# Patient Record
Sex: Male | Born: 1995 | Race: Black or African American | Hispanic: No | Marital: Married | Smoking: Former smoker
Health system: Southern US, Community
[De-identification: ages and names within clinical notes are randomized; demographics above are authoritative.]

## PROBLEM LIST (undated history)

## (undated) HISTORY — PX: APPENDECTOMY: SHX54

---
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
## 2014-09-12 ENCOUNTER — Ambulatory Visit (INDEPENDENT_AMBULATORY_CARE_PROVIDER_SITE_OTHER): Payer: BC Managed Care – PPO | Admitting: Internal Medicine

## 2014-09-12 VITALS — BP 110/64 | HR 78 | Temp 98.1°F | Resp 16 | Ht 68.75 in | Wt 128.2 lb

## 2014-09-12 DIAGNOSIS — R1030 Lower abdominal pain, unspecified: Secondary | ICD-10-CM

## 2014-09-12 DIAGNOSIS — K529 Noninfective gastroenteritis and colitis, unspecified: Secondary | ICD-10-CM

## 2014-09-12 LAB — POCT CBC
Granulocyte percent: 61.7 %G (ref 37–80)
HEMATOCRIT: 47.9 % (ref 43.5–53.7)
HEMOGLOBIN: 15.4 g/dL (ref 14.1–18.1)
LYMPH, POC: 1.3 (ref 0.6–3.4)
MCH: 28.8 pg (ref 27–31.2)
MCHC: 32.2 g/dL (ref 31.8–35.4)
MCV: 89.5 fL (ref 80–97)
MID (cbc): 0.3 (ref 0–0.9)
MPV: 7.4 fL (ref 0–99.8)
POC Granulocyte: 2.5 (ref 2–6.9)
POC LYMPH PERCENT: 31.3 %L (ref 10–50)
POC MID %: 7 % (ref 0–12)
Platelet Count, POC: 169 10*3/uL (ref 142–424)
RBC: 5.35 M/uL (ref 4.69–6.13)
RDW, POC: 14.2 %
WBC: 4.1 10*3/uL — AB (ref 4.6–10.2)

## 2014-09-12 LAB — POCT URINALYSIS DIPSTICK
BILIRUBIN UA: NEGATIVE
Blood, UA: NEGATIVE
GLUCOSE UA: NEGATIVE
KETONES UA: NEGATIVE
LEUKOCYTES UA: NEGATIVE
NITRITE UA: NEGATIVE
Protein, UA: NEGATIVE
Spec Grav, UA: 1.01
Urobilinogen, UA: 0.2
pH, UA: 6

## 2014-09-12 NOTE — Patient Instructions (Signed)
Food Choices to Help Relieve Diarrhea °When you have diarrhea, the foods you eat and your eating habits are very important. Choosing the right foods and drinks can help relieve diarrhea. Also, because diarrhea can last up to 7 days, you need to replace lost fluids and electrolytes (such as sodium, potassium, and chloride) in order to help prevent dehydration.  °WHAT GENERAL GUIDELINES DO I NEED TO FOLLOW? °· Slowly drink 1 cup (8 oz) of fluid for each episode of diarrhea. If you are getting enough fluid, your urine will be clear or pale yellow. °· Eat starchy foods. Some good choices include white rice, white toast, pasta, low-fiber cereal, baked potatoes (without the skin), saltine crackers, and bagels. °· Avoid large servings of any cooked vegetables. °· Limit fruit to two servings per day. A serving is ½ cup or 1 small piece. °· Choose foods with less than 2 g of fiber per serving. °· Limit fats to less than 8 tsp (38 g) per day. °· Avoid fried foods. °· Eat foods that have probiotics in them. Probiotics can be found in certain dairy products. °· Avoid foods and beverages that may increase the speed at which food moves through the stomach and intestines (gastrointestinal tract). Things to avoid include: °¨ High-fiber foods, such as dried fruit, raw fruits and vegetables, nuts, seeds, and whole grain foods. °¨ Spicy foods and high-fat foods. °¨ Foods and beverages sweetened with high-fructose corn syrup, honey, or sugar alcohols such as xylitol, sorbitol, and mannitol. °WHAT FOODS ARE RECOMMENDED? °Grains °White rice. White, French, or pita breads (fresh or toasted), including plain rolls, buns, or bagels. White pasta. Saltine, soda, or graham crackers. Pretzels. Low-fiber cereal. Cooked cereals made with water (such as cornmeal, farina, or cream cereals). Plain muffins. Matzo. Melba toast. Zwieback.  °Vegetables °Potatoes (without the skin). Strained tomato and vegetable juices. Most well-cooked and canned  vegetables without seeds. Tender lettuce. °Fruits °Cooked or canned applesauce, apricots, cherries, fruit cocktail, grapefruit, peaches, pears, or plums. Fresh bananas, apples without skin, cherries, grapes, cantaloupe, grapefruit, peaches, oranges, or plums.  °Meat and Other Protein Products °Baked or boiled chicken. Eggs. Tofu. Fish. Seafood. Smooth peanut butter. Ground or well-cooked tender beef, ham, veal, lamb, pork, or poultry.  °Dairy °Plain yogurt, kefir, and unsweetened liquid yogurt. Lactose-free milk, buttermilk, or soy milk. Plain hard cheese. °Beverages °Sport drinks. Clear broths. Diluted fruit juices (except prune). Regular, caffeine-free sodas such as ginger ale. Water. Decaffeinated teas. Oral rehydration solutions. Sugar-free beverages not sweetened with sugar alcohols. °Other °Bouillon, broth, or soups made from recommended foods.  °The items listed above may not be a complete list of recommended foods or beverages. Contact your dietitian for more options. °WHAT FOODS ARE NOT RECOMMENDED? °Grains °Whole grain, whole wheat, bran, or rye breads, rolls, pastas, crackers, and cereals. Wild or brown rice. Cereals that contain more than 2 g of fiber per serving. Corn tortillas or taco shells. Cooked or dry oatmeal. Granola. Popcorn. °Vegetables °Raw vegetables. Cabbage, broccoli, Brussels sprouts, artichokes, baked beans, beet greens, corn, kale, legumes, peas, sweet potatoes, and yams. Potato skins. Cooked spinach and cabbage. °Fruits °Dried fruit, including raisins and dates. Raw fruits. Stewed or dried prunes. Fresh apples with skin, apricots, mangoes, pears, raspberries, and strawberries.  °Meat and Other Protein Products °Chunky peanut butter. Nuts and seeds. Beans and lentils. Bacon.  °Dairy °High-fat cheeses. Milk, chocolate milk, and beverages made with milk, such as milk shakes. Cream. Ice cream. °Sweets and Desserts °Sweet rolls, doughnuts, and sweet breads. Pancakes   and waffles. °Fats and  Oils °Butter. Cream sauces. Margarine. Salad oils. Plain salad dressings. Olives. Avocados.  °Beverages °Caffeinated beverages (such as coffee, tea, soda, or energy drinks). Alcoholic beverages. Fruit juices with pulp. Prune juice. Soft drinks sweetened with high-fructose corn syrup or sugar alcohols. °Other °Coconut. Hot sauce. Chili powder. Mayonnaise. Gravy. Cream-based or milk-based soups.  °The items listed above may not be a complete list of foods and beverages to avoid. Contact your dietitian for more information. °WHAT SHOULD I DO IF I BECOME DEHYDRATED? °Diarrhea can sometimes lead to dehydration. Signs of dehydration include dark urine and dry mouth and skin. If you think you are dehydrated, you should rehydrate with an oral rehydration solution. These solutions can be purchased at pharmacies, retail stores, or online.  °Drink ½-1 cup (120-240 mL) of oral rehydration solution each time you have an episode of diarrhea. If drinking this amount makes your diarrhea worse, try drinking smaller amounts more often. For example, drink 1-3 tsp (5-15 mL) every 5-10 minutes.  °A general rule for staying hydrated is to drink 1½-2 L of fluid per day. Talk to your health care provider about the specific amount you should be drinking each day. Drink enough fluids to keep your urine clear or pale yellow. °Document Released: 01/24/2004 Document Revised: 11/08/2013 Document Reviewed: 09/26/2013 °ExitCare® Patient Information ©2015 [employer]. This information is not intended to replace advice given to you by your health care provider. Make sure you discuss any questions you have with your health care provider. °Viral Gastroenteritis °Viral gastroenteritis is also known as stomach flu. This condition affects the stomach and intestinal tract. It can cause sudden diarrhea and vomiting. The illness typically lasts 3 to 8 days. Most people develop an immune response that eventually gets rid of the virus. While this natural  response develops, the virus can make you quite ill. °CAUSES  °Many different viruses can cause gastroenteritis, such as rotavirus or noroviruses. You can catch one of these viruses by consuming contaminated food or water. You may also catch a virus by sharing utensils or other personal items with an infected person or by touching a contaminated surface. °SYMPTOMS  °The most common symptoms are diarrhea and vomiting. These problems can cause a severe loss of body fluids (dehydration) and a body salt (electrolyte) imbalance. Other symptoms may include: °· Fever. °· Headache. °· Fatigue. °· Abdominal pain. °DIAGNOSIS  °Your caregiver can usually diagnose viral gastroenteritis based on your symptoms and a physical exam. A stool sample may also be taken to test for the presence of viruses or other infections. °TREATMENT  °This illness typically goes away on its own. Treatments are aimed at rehydration. The most serious cases of viral gastroenteritis involve vomiting so severely that you are not able to keep fluids down. In these cases, fluids must be given through an intravenous line (IV). °HOME CARE INSTRUCTIONS  °· Drink enough fluids to keep your urine clear or pale yellow. Drink small amounts of fluids frequently and increase the amounts as tolerated. °· Ask your caregiver for specific rehydration instructions. °· Avoid: °¨ Foods high in sugar. °¨ Alcohol. °¨ Carbonated drinks. °¨ Tobacco. °¨ Juice. °¨ Caffeine drinks. °¨ Extremely hot or cold fluids. °¨ Fatty, greasy foods. °¨ Too much intake of anything at one time. °¨ Dairy products until 24 to 48 hours after diarrhea stops. °· You may consume probiotics. Probiotics are active cultures of beneficial bacteria. They may lessen the amount and number of diarrheal stools in adults. Probiotics   can be found in yogurt with active cultures and in supplements. °· Wash your hands well to avoid spreading the virus. °· Only take over-the-counter or prescription medicines for  pain, discomfort, or fever as directed by your caregiver. Do not give aspirin to children. Antidiarrheal medicines are not recommended. °· Ask your caregiver if you should continue to take your regular prescribed and over-the-counter medicines. °· Keep all follow-up appointments as directed by your caregiver. °SEEK IMMEDIATE MEDICAL CARE IF:  °· You are unable to keep fluids down. °· You do not urinate at least once every 6 to 8 hours. °· You develop shortness of breath. °· You notice blood in your stool or vomit. This may look like coffee grounds. °· You have abdominal pain that increases or is concentrated in one small area (localized). °· You have persistent vomiting or diarrhea. °· You have a fever. °· The patient is a child younger than 3 months, and he or she has a fever. °· The patient is a child older than 3 months, and he or she has a fever and persistent symptoms. °· The patient is a child older than 3 months, and he or she has a fever and symptoms suddenly get worse. °· The patient is a baby, and he or she has no tears when crying. °MAKE SURE YOU:  °· Understand these instructions. °· Will watch your condition. °· Will get help right away if you are not doing well or get worse. °Document Released: 11/03/2005 Document Revised: 01/26/2012 Document Reviewed: 08/20/2011 °ExitCare® Patient Information ©2015 [employer]. This information is not intended to replace advice given to you by your health care provider. Make sure you discuss any questions you have with your health care provider. ° °

## 2014-09-12 NOTE — Progress Notes (Signed)
Subjective:    Patient ID: Cole Wolfe, male    DOB: [birth year], 18 y.o.   MRN: [id number]  HPI  Patient is here complaining of suprapubic abdominal pain, in a band-like pattern. This pain started on Friday, he states it hasn't worsened but has persisted with no relief. He described the pain as a cramping pain. He is also complaining of diarrhea that started on Saturday. He states he has an episode of diarrhea after every meal. He notes the cramping pain is relieved temporarily after a bowel movement. He denies fever or chills. He denies history of GI issues. He denies recent antibiotic use. He denies sick contacts at home. He doesn't complain of any urinary issues. He denies nausea and vomiting. He has tried [employer] Company[employer]pto Bismol with no relief. Started after eating food that may have been bad  Review of Systems     Objective:   Physical Exam  Vitals reviewed. Constitutional: He is oriented to person, place, and time. He appears well-developed and well-nourished. No distress.  HENT:  Head: Normocephalic.  Mouth/Throat: Oropharynx is clear and moist.  Eyes: EOM are normal. No scleral icterus.  Neck: Normal range of motion.  Cardiovascular: Normal rate and normal heart sounds.   Pulmonary/Chest: Effort normal and breath sounds normal.  Abdominal: Soft. Bowel sounds are normal. He exhibits no mass. There is no tenderness.  Neurological: He is alert and oriented to person, place, and time. He exhibits normal muscle tone. Coordination normal.  Skin: He is not diaphoretic.  Psychiatric: He has a normal mood and affect. His behavior is normal. Judgment and thought content normal.   Results for orders placed in visit on 07/14/13  GC/CHLAMYDIA PROBE AMP      Result Value Ref Range   CT Probe RNA POSITIVE (*)    GC Probe RNA NEGATIVE    COMPREHENSIVE METABOLIC PANEL      Result Value Ref Range   Sodium 140  135 - 145 mEq/L   Potassium 4.3  3.5 - 5.3 mEq/L   Chloride 101  96 - 112 mEq/L   CO2 27  19 - 32 mEq/L   Glucose, Bld 88  70 - 99 mg/dL   BUN 15  6 - 23 mg/dL   Creat 4.091.04  8.110.50 - 9.141.35 mg/dL   Total Bilirubin 1.0  0.3 - 1.2 mg/dL   Alkaline Phosphatase 55  39 - 117 U/L   AST 30  0 - 37 U/L   ALT 17  0 - 53 U/L   Total Protein 7.7  6.0 - 8.3 g/dL   Albumin 4.7  3.5 - 5.2 g/dL   Calcium 9.6  8.4 - 78.210.5 mg/dL  TSH      Result Value Ref Range   TSH 1.046  0.350 - 4.500 uIU/mL  LIPID PANEL      Result Value Ref Range   Cholesterol 162  0 - 169 mg/dL   Triglycerides 43  <956<150 mg/dL   HDL 74  >21>34 mg/dL   Total CHOL/HDL Ratio 2.2     VLDL 9  0 - 40 mg/dL   LDL Cholesterol 79  0 - 109 mg/dL  POCT CBC      Result Value Ref Range   WBC 8.7  4.6 - 10.2 K/uL   Lymph, poc 2.1  0.6 - 3.4   POC LYMPH PERCENT 23.8  10 - 50 %L   MID (cbc) 0.5  0 - 0.9   POC MID % 5.4  0 -  12 %M   POC Granulocyte 6.2  2 - 6.9   Granulocyte percent 70.8  37 - 80 %G   RBC 5.14  4.69 - 6.13 M/uL   Hemoglobin 15.1  14.1 - 18.1 g/dL   HCT, POC 16.147.6  09.643.5 - 53.7 %   MCV 92.7  80 - 97 fL   MCH, POC 29.4  27 - 31.2 pg   MCHC 31.7 (*) 31.8 - 35.4 g/dL   RDW, POC 04.513.4     Platelet Count, POC 157  142 - 424 K/uL   MPV 9.9  0 - 99.8 fL  POCT UA - MICROSCOPIC ONLY      Result Value Ref Range   WBC, Ur, HPF, POC neg     RBC, urine, microscopic 5-8     Bacteria, U Microscopic trace     Mucus, UA neg     Epithelial cells, urine per micros neg     Crystals, Ur, HPF, POC 0-1     Casts, Ur, LPF, POC neg     Yeast, UA neg    POCT URINALYSIS DIPSTICK      Result Value Ref Range   Color, UA yellow     Clarity, UA clear     Glucose, UA neg     Bilirubin, UA neg     Ketones, UA neg     Spec Grav, UA 1.020     Blood, UA trace     pH, UA 7.0     Protein, UA neg     Urobilinogen, UA 0.2     Nitrite, UA neg     Leukocytes, UA Negative     This is lab work from 2014  Results for orders placed in visit on 09/12/14  POCT CBC      Result Value Ref Range   WBC 4.1 (*) 4.6 - 10.2 K/uL   Lymph,  poc 1.3  0.6 - 3.4   POC LYMPH PERCENT 31.3  10 - 50 %L   MID (cbc) 0.3  0 - 0.9   POC MID % 7.0  0 - 12 %M   POC Granulocyte 2.5  2 - 6.9   Granulocyte percent 61.7  37 - 80 %G   RBC 5.35  4.69 - 6.13 M/uL   Hemoglobin 15.4  14.1 - 18.1 g/dL   HCT, POC 40.947.9  81.143.5 - 53.7 %   MCV 89.5  80 - 97 fL   MCH, POC 28.8  27 - 31.2 pg   MCHC 32.2  31.8 - 35.4 g/dL   RDW, POC 91.414.2     Platelet Count, POC 169  142 - 424 K/uL   MPV 7.4  0 - 99.8 fL  POCT URINALYSIS DIPSTICK      Result Value Ref Range   Color, UA Yellow     Clarity, UA Clear     Glucose, UA Neg     Bilirubin, UA Neg     Ketones, UA Neg     Spec Grav, UA 1.010     Blood, UA Neg     pH, UA 6.0     Protein, UA Neg     Urobilinogen, UA 0.2     Nitrite, UA Neg     Leukocytes, UA Negative          Assessment & Plan:  Gastroenteritis BRAT diet Peptobismol/Gatorade

## 2014-09-23 ENCOUNTER — Ambulatory Visit (INDEPENDENT_AMBULATORY_CARE_PROVIDER_SITE_OTHER): Payer: BC Managed Care – PPO

## 2014-09-24 ENCOUNTER — Ambulatory Visit (INDEPENDENT_AMBULATORY_CARE_PROVIDER_SITE_OTHER): Payer: BC Managed Care – PPO | Admitting: Family Medicine

## 2014-09-24 ENCOUNTER — Ambulatory Visit (INDEPENDENT_AMBULATORY_CARE_PROVIDER_SITE_OTHER): Payer: BC Managed Care – PPO

## 2014-09-24 VITALS — BP 105/65 | HR 69 | Temp 98.8°F | Resp 16 | Ht 69.0 in | Wt 127.0 lb

## 2014-09-24 DIAGNOSIS — S62308A Unspecified fracture of other metacarpal bone, initial encounter for closed fracture: Secondary | ICD-10-CM

## 2014-09-24 DIAGNOSIS — M79641 Pain in right hand: Secondary | ICD-10-CM

## 2014-09-24 NOTE — Patient Instructions (Signed)
Take ibuprofen for pain and swelling You will be contacted to make an appt. With ortho

## 2014-09-24 NOTE — Progress Notes (Signed)
   Subjective:    Patient ID: Cole Wolfe, male    DOB: Apr 18, 1996, 18 y.o.   MRN: [id number] There are no active problems to display for this patient.  Prior to Admission medications   Not on File   No Known Allergies  HPI  This is a 18 year old male with no significant PMH who is presenting with right hand pain and swelling after punching a wall 3 days ago. Most of his pain is in his 5th metatarsal. He feels his pain is getting a little better but the swelling has stayed the same. His pain now is more "stiffness". He had some numbness in his pinky finger the first 2 days but that has gone away. He has tried ice and ibuprofen with some relief. He has never injured this hand before.  Review of Systems  Constitutional: Negative.   Musculoskeletal: Positive for joint swelling and arthralgias.  Skin: Positive for color change.      Objective:   Physical Exam  Constitutional: He is oriented to person, place, and time. He appears well-developed and well-nourished. No distress.  HENT:  Head: Normocephalic and atraumatic.  Right Ear: Hearing normal.  Left Ear: Hearing normal.  Nose: Nose normal.  Eyes: Conjunctivae and lids are normal. Right eye exhibits no discharge. Left eye exhibits no discharge. No scleral icterus.  Pulmonary/Chest: Effort normal. No respiratory distress.  Musculoskeletal:       Left hand: He exhibits decreased range of motion (cannot fully extend 5th digit.), tenderness, bony tenderness and swelling (over lateral hand, most significant swelling at the base of the 5th metacarpal). He exhibits normal capillary refill. Normal sensation noted. Decreased strength (with thumb/finger opposition, 5th digit extension/flexion/abduction/adduction. full strengh in other fingers and in wrist.) noted.  Neurological: He is alert and oriented to person, place, and time. No sensory deficit.  Skin: Skin is warm, dry and intact. There is erythema.  Psychiatric: He has a normal mood  and affect. His speech is normal and behavior is normal. Thought content normal.   [facility] reading (PRIMARY) by  Dr. Neva SeatGreene: displaced fracture of the 5th metacarpal base.     Assessment & Plan:  1. Right hand pain 2. Closed fracture of 5th metacarpal, initial encounter  Patient has a displaced fracture of the 5th metacarpal base. He was placed in a volar/dorsal splint with 30 degrees of wrist extension. He was urgently referred to a Hydrographic surveyorhand surgeon. He is not having much pain so he will continue with ibuprofen.  - DG Hand Complete Right; Future - Ambulatory referral to Orthopedic Surgery   Roswell MinersNicole V. Dyke BrackettBush, PA-C, MHS [facility]  09/25/2014

## 2014-09-26 ENCOUNTER — Telehealth: Payer: Self-pay

## 2014-09-26 NOTE — Telephone Encounter (Signed)
Patient is needing xray disc for hand injury. Thank you! His father will pick up.  [phone]

## 2014-09-26 NOTE — Telephone Encounter (Signed)
Printed phone message and gave to Xray. Called father to advise the disc would be ready around 5pm

## 2014-09-26 NOTE — Progress Notes (Signed)
Xray read and patient discussed with Cole Wolfe. Agree with assessment and plan of care per her note.  XR report noted: FINDINGS: Fracture of the base of the fifth metacarpal with mild lateral and dorsal displacement of the distal fragment. No visible intra-articular extension.

## 2015-05-16 IMAGING — CR DG HAND COMPLETE 3+V*R*
3 series · 3 of 3 positions shown · non-contrast
Comparison: Right middle finger dated 06/09/2013.

CLINICAL DATA: Rt Hand injury, hit a wall 3 days ago - is swollen
and painful

EXAM:
RIGHT HAND - COMPLETE 3+ VIEW

[PA]
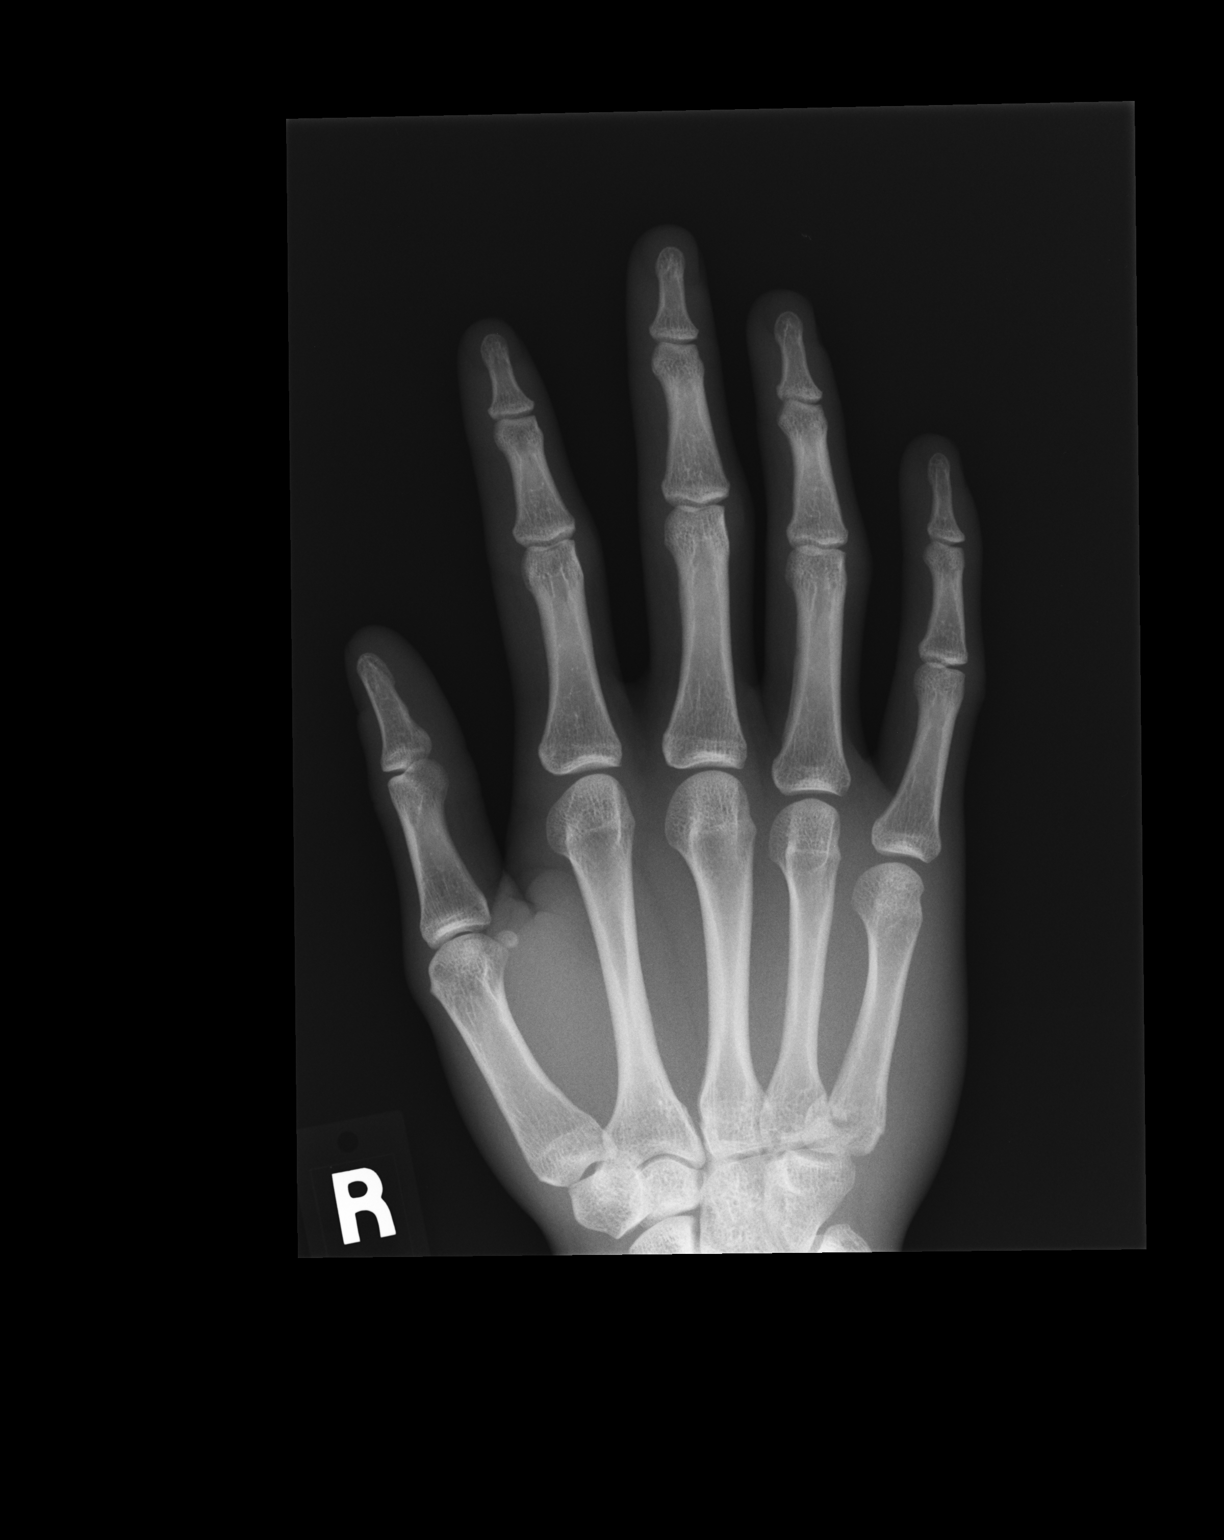

[lateral]
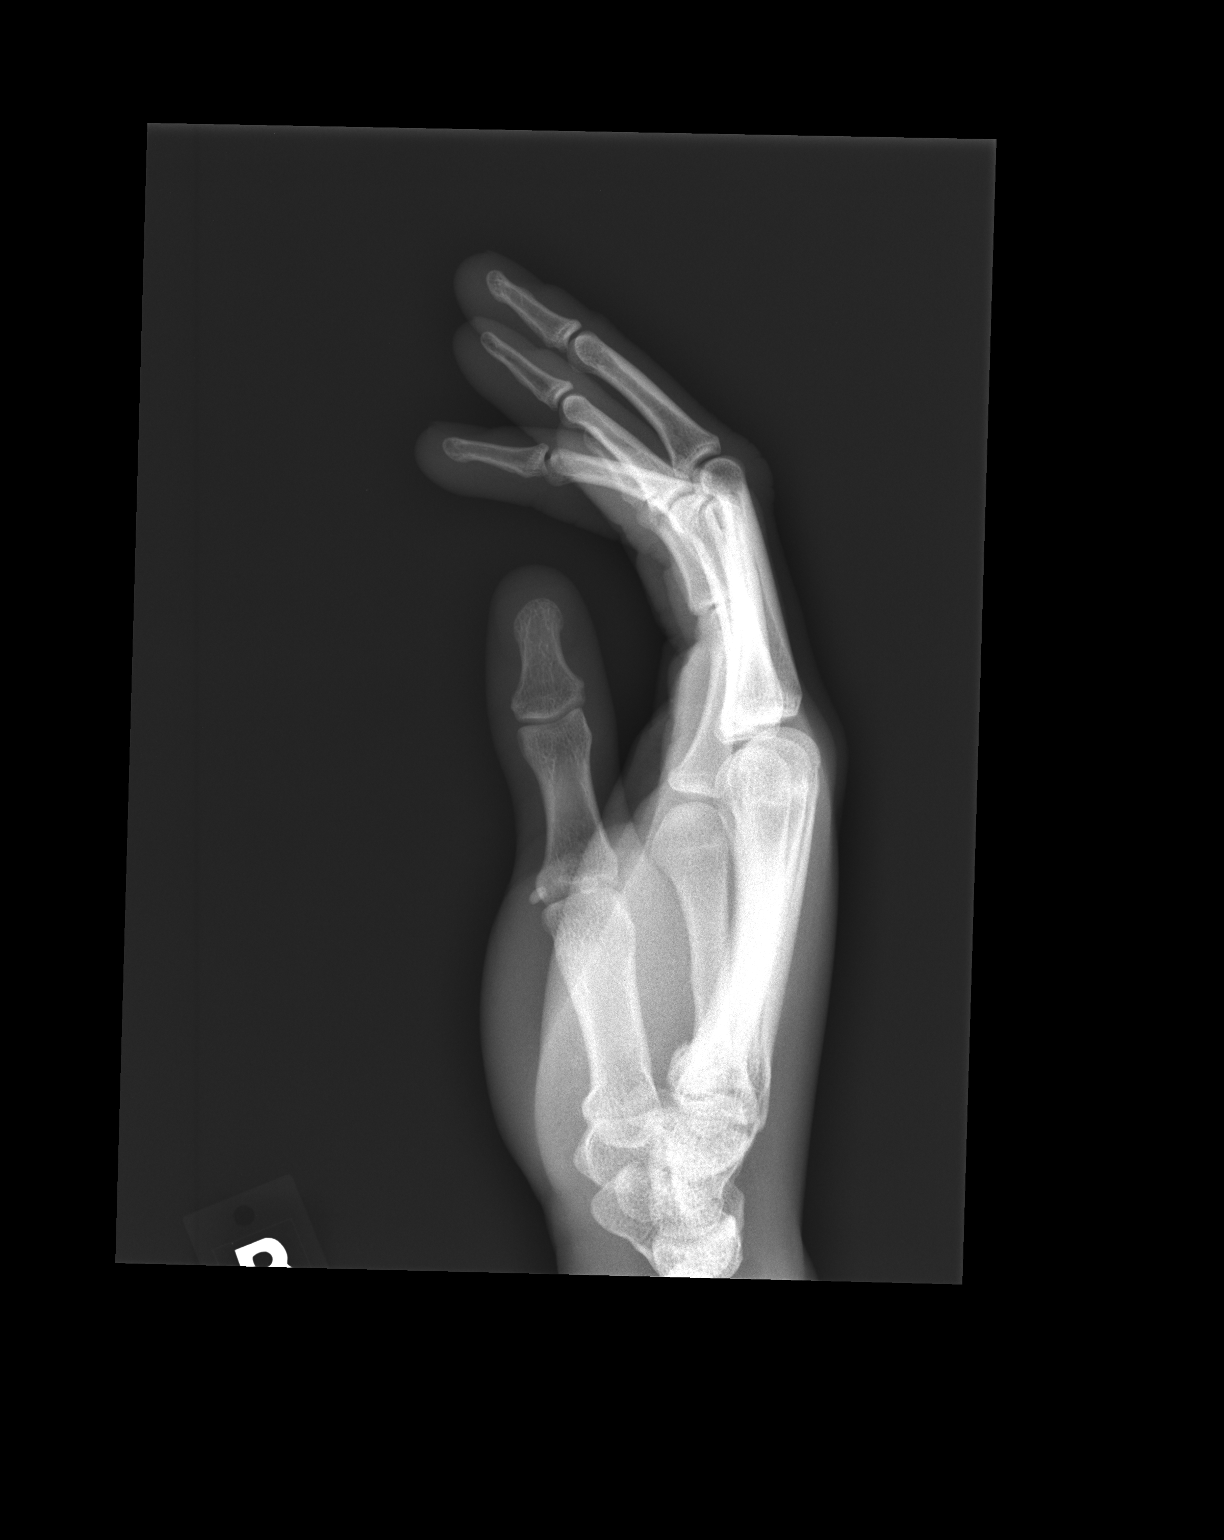

[pa obl]
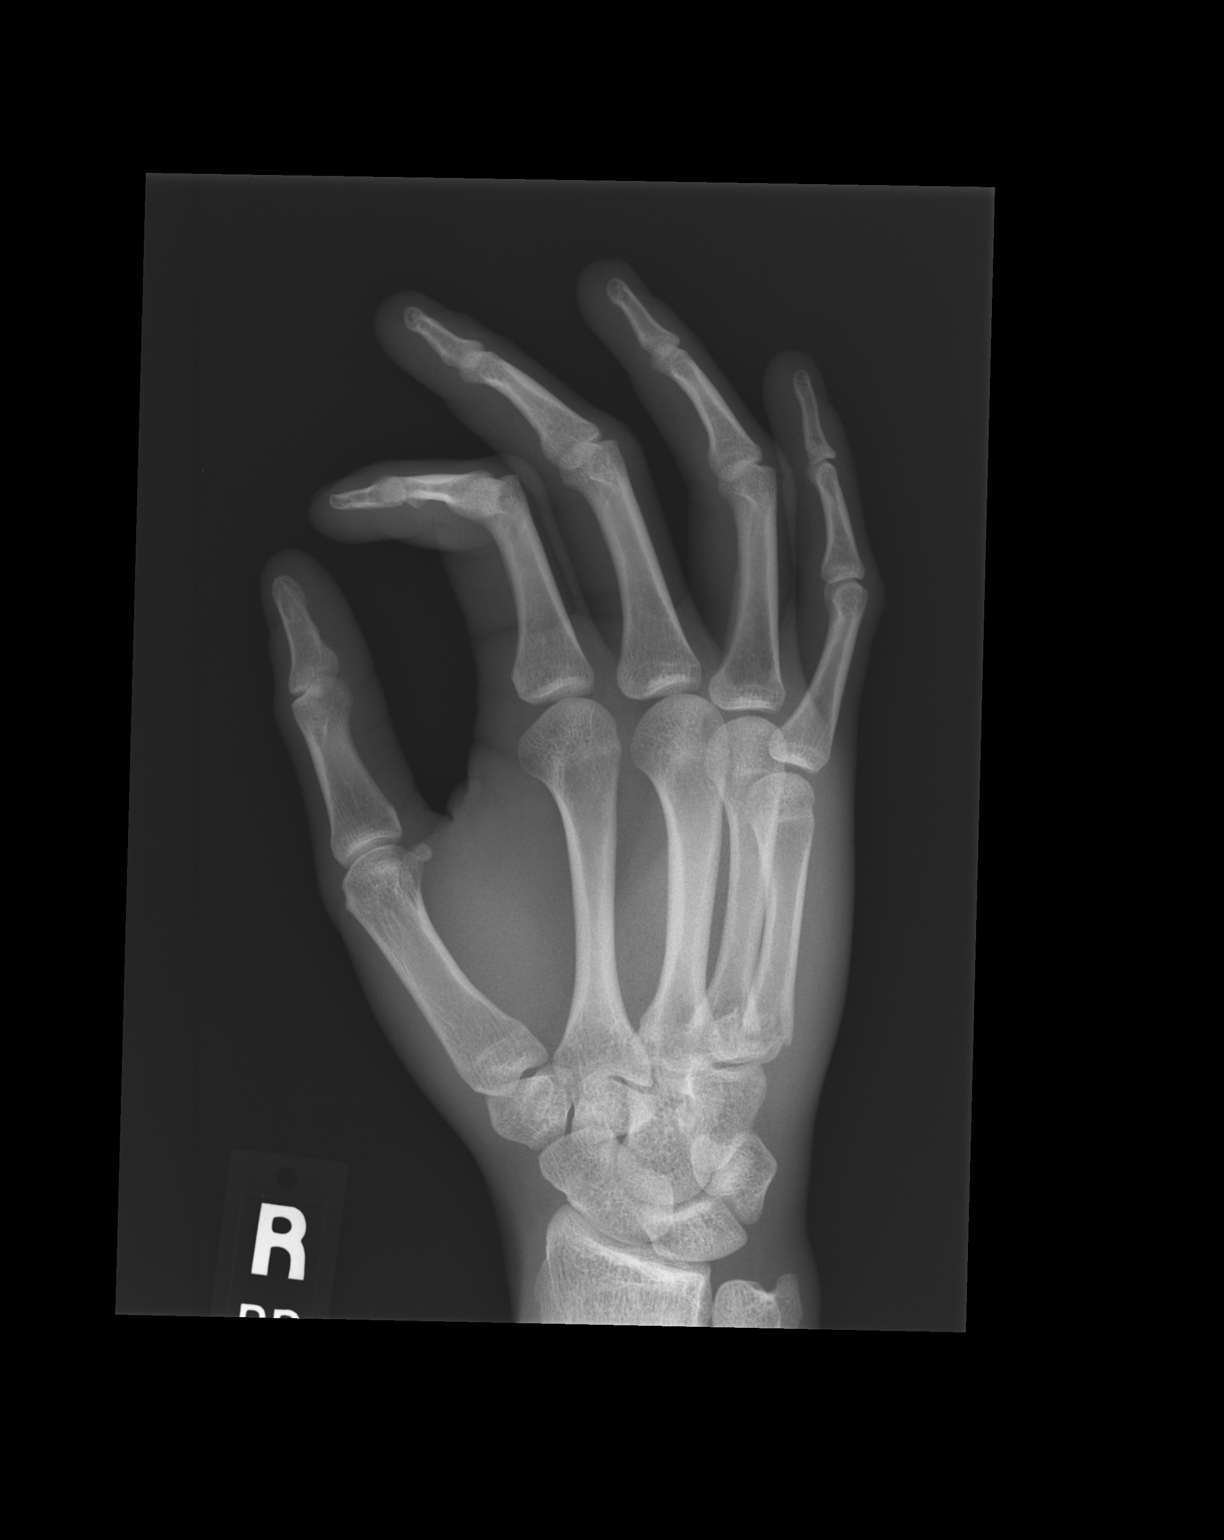

[3 of 3 positions shown; findings below may reference images not displayed]

FINDINGS: Fracture of the base of the fifth metacarpal with mild lateral and
dorsal displacement of the distal fragment. No visible
intra-articular extension.
IMPRESSION: Mildly displaced fracture of the base of the fifth metacarpal.

## 2015-08-21 ENCOUNTER — Ambulatory Visit (INDEPENDENT_AMBULATORY_CARE_PROVIDER_SITE_OTHER): Payer: BLUE CROSS/BLUE SHIELD | Admitting: Physician Assistant

## 2015-08-21 VITALS — BP 110/72 | HR 80 | Temp 98.1°F | Resp 18 | Ht 69.75 in | Wt 132.0 lb

## 2015-08-21 DIAGNOSIS — Z7251 High risk heterosexual behavior: Secondary | ICD-10-CM | POA: Diagnosis not present

## 2015-08-21 DIAGNOSIS — R300 Dysuria: Secondary | ICD-10-CM | POA: Diagnosis not present

## 2015-08-21 LAB — POCT URINALYSIS DIP (MANUAL ENTRY)
BILIRUBIN UA: NEGATIVE
GLUCOSE UA: NEGATIVE
Ketones, POC UA: NEGATIVE
Leukocytes, UA: NEGATIVE
NITRITE UA: NEGATIVE
Protein Ur, POC: NEGATIVE
Spec Grav, UA: 1.01
Urobilinogen, UA: 0.2
pH, UA: 7

## 2015-08-21 LAB — POC MICROSCOPIC URINALYSIS (UMFC): MUCUS RE: ABSENT

## 2015-08-21 NOTE — Progress Notes (Signed)
Subjective:    Patient ID: Cole Wolfe, male    DOB: 30-Dec-1995, 19 y.o.   MRN: [id number]  HPI Patient presents for dysuria that has been present for the past week and states that has drank only soda and just started drinking water again today. Thinks that he might have had urinary frequency, but denies decreased vol, urgency, hematuria, fever, or N/V. States that he has had two partners in the past 2 months and have used condoms, however, did have a condom pop in that 2 month time. Has been treated for chlamydia in the past. NKDA.   Review of Systems As noted above.    Objective:   Physical Exam  Constitutional: He is oriented to person, place, and time. He appears well-developed and well-nourished. No distress.  Blood pressure 110/72, pulse 80, temperature 98.1 F (36.7 C), temperature source Oral, resp. rate 18, height 5' 9.75" (1.772 m), weight 132 lb (59.875 kg), SpO2 98 %.  HENT:  Head: Normocephalic and atraumatic.  Right Ear: External ear normal.  Left Ear: External ear normal.  Eyes: Conjunctivae are normal. Right eye exhibits no discharge. Left eye exhibits no discharge. No scleral icterus.  Cardiovascular: Normal rate, regular rhythm and normal heart sounds.  Exam reveals no gallop and no friction rub.   No murmur heard. Pulmonary/Chest: Effort normal and breath sounds normal. No respiratory distress. He has no wheezes. He has no rales. He exhibits no tenderness.  Abdominal: Soft. Bowel sounds are normal. He exhibits no distension. There is no hepatosplenomegaly. There is no tenderness. There is no rebound, no guarding and no CVA tenderness. No hernia. Hernia confirmed negative in the right inguinal area and confirmed negative in the left inguinal area.  Genitourinary: Testes normal and penis normal. Right testis shows no mass, no swelling and no tenderness. Right testis is descended. Left testis shows no mass, no swelling and no tenderness. Left testis is descended.  Uncircumcised. No phimosis, paraphimosis, hypospadias, penile erythema or penile tenderness. No discharge found.  Lymphadenopathy:       Right: No inguinal adenopathy present.       Left: No inguinal adenopathy present.  Neurological: He is alert and oriented to person, place, and time.  Skin: Skin is warm and dry. No rash noted. He is not diaphoretic. No erythema. No pallor.  Psychiatric: He has a normal mood and affect. His behavior is normal. Judgment and thought content normal.   Results for orders placed or performed in visit on 08/21/15  POCT Microscopic Urinalysis ([facility])  Result Value Ref Range   WBC,UR,HPF,POC None None WBC/hpf   RBC,UR,HPF,POC None None RBC/hpf   Bacteria None None   Mucus Absent Absent   Epithelial Cells, UR Per Microscopy None None cells/hpf  POCT urinalysis dipstick  Result Value Ref Range   Color, UA yellow yellow   Clarity, UA clear clear   Glucose, UA negative negative   Bilirubin, UA negative negative   Ketones, POC UA negative negative   Spec Grav, UA 1.010    Blood, UA trace-intact (A) negative   pH, UA 7.0    Protein Ur, POC negative negative   Urobilinogen, UA 0.2    Nitrite, UA Negative Negative   Leukocytes, UA Negative Negative      Assessment & Plan:  1. Unprotected sexual intercourse - GC/Chlamydia Probe Amp - HIV antibody - RPR  2. Dysuria Increase water intake and will await rest of lab results.  - POCT Microscopic Urinalysis ([facility]) - POCT urinalysis  dipstick   Janan Ridge PA-C  [facility] 08/21/2015 3:53 PM

## 2015-08-22 LAB — GC/CHLAMYDIA PROBE AMP
CT Probe RNA: NEGATIVE
GC Probe RNA: NEGATIVE

## 2015-08-22 LAB — RPR

## 2015-08-22 LAB — HIV ANTIBODY (ROUTINE TESTING W REFLEX): HIV 1&2 Ab, 4th Generation: NONREACTIVE

## 2015-08-23 ENCOUNTER — Telehealth: Payer: Self-pay | Admitting: Physician Assistant

## 2015-08-23 NOTE — Telephone Encounter (Signed)
Relayed negative results.

## 2015-12-05 ENCOUNTER — Ambulatory Visit (INDEPENDENT_AMBULATORY_CARE_PROVIDER_SITE_OTHER): Payer: BLUE CROSS/BLUE SHIELD | Admitting: Physician Assistant

## 2015-12-05 VITALS — BP 124/72 | HR 85 | Temp 98.5°F | Resp 16 | Ht 69.0 in | Wt 139.0 lb

## 2015-12-05 DIAGNOSIS — Z418 Encounter for other procedures for purposes other than remedying health state: Secondary | ICD-10-CM

## 2015-12-05 DIAGNOSIS — R220 Localized swelling, mass and lump, head: Secondary | ICD-10-CM | POA: Diagnosis not present

## 2015-12-05 DIAGNOSIS — Z299 Encounter for prophylactic measures, unspecified: Secondary | ICD-10-CM

## 2015-12-05 MED ORDER — EPINEPHRINE 0.3 MG/0.3ML IJ SOAJ: 0.3000 mg | Freq: Once | INTRAMUSCULAR | Status: DC

## 2015-12-05 NOTE — Patient Instructions (Signed)
Take 1 zyrtec daily for the next five days.  Take 1 Zantac in the morning and night for the next five days.  Take 1-2 benadryl as needed for itchiness or swelling.

## 2015-12-05 NOTE — Progress Notes (Signed)
12/05/2015 7:30 PM   DOB: Apr 18, 1996 / MRN: [id number]  SUBJECTIVE:  Cole Wolfe is a 20 y.o. male presenting for bilateral eye swelling that is occuring after waking from sleep.  Reports this typically resolves within a few minutes after waking up and is better with showering. Associates some itching.  Reports one episode of lip swelling which also resolved.  Denies lip and tongue swelling.  Denies rash elsewhere.  Reports this is a new problem.  Denies any new foods, strenuous exercise, new medications, and has not been taking any antibiotics in the recent past.   He has No Known Allergies.   He  has no past medical history on file.    He  reports that he has been smoking.  He has never used smokeless tobacco. He reports that he does not drink alcohol or use illicit drugs. He  reports that he does not engage in sexual activity. The patient  has past surgical history that includes Appendectomy.  His family history includes Diabetes in his mother.  Review of Systems  Constitutional: Negative for fever and chills.  Eyes: Negative for double vision, photophobia and pain.  Respiratory: Negative for cough and shortness of breath.   Cardiovascular: Negative for chest pain.  Gastrointestinal: Negative for nausea and abdominal pain.  Genitourinary: Negative for dysuria, urgency and frequency.  Musculoskeletal: Negative for myalgias.  Skin: Negative for rash.  Neurological: Negative for dizziness, tingling and headaches.  Psychiatric/Behavioral: Negative for depression. The patient is not nervous/anxious.     Problem list and medications reviewed and updated by myself where necessary, and exist elsewhere in the encounter.   OBJECTIVE:  BP 124/72 mmHg  Pulse 85  Temp(Src) 98.5 F (36.9 C) (Oral)  Resp 16  Ht  (1.753 m)  Wt 139 lb (63.05 kg)  BMI 20.52 kg/m2  SpO2 98%  Physical Exam  Constitutional: He is oriented to person, place, and time. He appears well-developed. He  does not appear ill.  HENT:  Negative for periorbital edema, tenderness, and erythema. Lips, tongue and throat negative for swelling.   Eyes: Conjunctivae and EOM are normal. Pupils are equal, round, and reactive to light.  Cardiovascular: Normal rate.   Pulmonary/Chest: Effort normal.  Abdominal: He exhibits no distension.  Musculoskeletal: Normal range of motion.  Neurological: He is alert and oriented to person, place, and time. No cranial nerve deficit. Coordination normal.  Skin: Skin is warm and dry. He is not diaphoretic.  Psychiatric: He has a normal mood and affect.  Nursing note and vitals reviewed.   No results found for this or any previous visit (from the past 48 hour(s)).  ASSESSMENT AND PLAN  Lecil was seen today for rash.  Diagnoses and all orders for this visit:  Facial swelling: There is no swelling or erythema by my exam.  This problem is likely resolving.  He likely came into contact with a new allergen, maybe through his work at the [employer].  Advised symptomatic treatment via AVS and that he purchase and carry an epi pen given his description of lip swelling.  RTC if his symptoms change or worsen.   Need for prophylactic measure -     EPINEPHrine 0.3 mg/0.3 mL IJ SOAJ injection; Inject 0.3 mLs (0.3 mg total) into the muscle once.   The patient was advised to call or return to clinic if he does not see an improvement in symptoms or to seek the care of the closest emergency department if he worsens  with the above plan.   Deliah Boston, MHS, PA-C [facility] 12/05/2015 7:30 PM

## 2015-12-06 ENCOUNTER — Telehealth: Payer: Self-pay

## 2015-12-06 NOTE — Telephone Encounter (Signed)
°  Needs note stating he was seen yesterday.  He will pick it up.  Please call when ready  [phone]

## 2015-12-07 NOTE — Telephone Encounter (Signed)
Note written and given to pt. °

## 2016-02-08 ENCOUNTER — Ambulatory Visit (INDEPENDENT_AMBULATORY_CARE_PROVIDER_SITE_OTHER): Payer: BLUE CROSS/BLUE SHIELD | Admitting: Family Medicine

## 2016-02-08 VITALS — BP 120/62 | HR 72 | Temp 98.8°F | Resp 16 | Ht 69.0 in | Wt 136.0 lb

## 2016-02-08 DIAGNOSIS — S39012A Strain of muscle, fascia and tendon of lower back, initial encounter: Secondary | ICD-10-CM | POA: Diagnosis not present

## 2016-02-08 MED ORDER — PREDNISONE 20 MG PO TABS: ORAL_TABLET | ORAL | Status: DC

## 2016-02-08 NOTE — Patient Instructions (Signed)
I believe you're problem is a muscle strain. I'm getting a medicine that I'm hoping will alleviate the problem and let you go back to work on Monday.  If you're not able to go back, please let me know so I can change the note and keep you working extra day or 2.  If you're getting worse, please return

## 2016-02-08 NOTE — Progress Notes (Signed)
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  Last night he was working and loading packages when he bent over and felt acute pain in his right lumbar region. He's had difficulty bending over ever since. He did not go to work today because of the pain.  Patient's had no fever, trouble with urination or difficulty with his bowels. She's had no numbness or radiation of the pain in his right leg.  Objective: No acute distress BP 120/62 mmHg  Pulse 72  Temp(Src) 98.8 F (37.1 C) (Oral)  Resp 16  Ht 5\' 9"  (1.753 m)  Wt 136 lb (61.689 kg)  BMI 20.07 kg/m2  SpO2 98% Straight leg raising is negative on the right and left. He has normal reflexes in his knees and ankles. Palpation of the back reveals no localized tenderness. Patient is unable to bend over and touch his toes because of pain.  Assessment: Acute lumbar strain  Note written for work to be out until Monday. Patient to call me if he is unable to go to work on Monday or if the pain is getting worse.    ICD-9-CM ICD-10-CM   1. Low back strain, initial encounter 847.2 S39.012A predniSONE (DELTASONE) 20 MG tablet     Signed, Elvina SidleKurt Ceil Roderick, MD

## 2016-02-11 ENCOUNTER — Ambulatory Visit: Payer: BLUE CROSS/BLUE SHIELD

## 2016-03-19 ENCOUNTER — Ambulatory Visit (INDEPENDENT_AMBULATORY_CARE_PROVIDER_SITE_OTHER): Payer: BLUE CROSS/BLUE SHIELD | Admitting: Physician Assistant

## 2016-03-19 VITALS — BP 118/72 | HR 73 | Temp 98.5°F | Resp 16 | Ht 69.0 in | Wt 134.8 lb

## 2016-03-19 DIAGNOSIS — M79644 Pain in right finger(s): Secondary | ICD-10-CM

## 2016-03-19 DIAGNOSIS — S60111A Contusion of right thumb with damage to nail, initial encounter: Secondary | ICD-10-CM | POA: Diagnosis not present

## 2016-03-19 NOTE — Progress Notes (Signed)
   Patient ID: Cole ButtsJustin A Wolfe, male    DOB: Apr 14, 1996, 20 y.o.   MRN: [id number]  PCP: No PCP Per Patient  Chief Complaint  Patient presents with  . Finger Injury    right thumb injury, slapped thumb in car door today     Subjective:  HPI Presents for evaluation of pain in the RIGHT thumb after he slammed it in a car door today. He tried to fenestrate the nail but wasn't able to.   RIGHT hand dominant.  The only pain is the discomfort as the blood collected under the nail. FROM.   Review of Systems No fever, chills.   There are no active problems to display for this patient.   No Known Allergies  Prior to Admission medications   Medication Sig Start Date End Date Taking? Authorizing Provider  EPINEPHrine 0.3 mg/0.3 mL IJ SOAJ injection Inject 0.3 mLs (0.3 mg total) into the muscle once. 12/05/15  Yes Ofilia NeasMichael L Clark, PA-C     Past Medical, Surgical Family and Social History reviewed and updated.        Objective:  Physical Exam  Constitutional: He is oriented to person, place, and time. He appears well-developed and well-nourished. He is active and cooperative. No distress.  BP 118/72 mmHg  Pulse 73  Temp(Src) 98.5 F (36.9 C) (Oral)  Resp 16  Ht 5\' 9"  (1.753 m)  Wt 134 lb 12.8 oz (61.145 kg)  BMI 19.90 kg/m2  SpO2 99%   Eyes: Conjunctivae are normal.  Pulmonary/Chest: Effort normal.  Musculoskeletal:       Right hand: He exhibits tenderness. He exhibits normal range of motion, no bony tenderness, normal capillary refill, no deformity, no laceration and no swelling. Normal sensation noted. Normal strength noted.       Hands: Neurological: He is alert and oriented to person, place, and time.  Skin: Skin is warm, dry and intact. No laceration noted. No cyanosis. Nails show no clubbing.  15% proximal nail subungual hematoma of the RIGHT thumb. With permission, 2 fenestrations made using an 18 gauge needle, with considerable relief.  Psychiatric: He has a  normal mood and affect. His speech is normal and behavior is normal.           Assessment & Plan:  1. Subungual hematoma of right thumb, initial encounter Local wound care. Anticipatory guidance.   Fernande Brashelle S. Heleena Miceli, PA-C Physician Assistant-Certified [facility]

## 2016-03-19 NOTE — Patient Instructions (Addendum)
     IF you received an x-ray today, you will receive an invoice from [facility]. Please contact [facility] at [phone] with questions or concerns regarding your invoice.   IF you received labwork today, you will receive an invoice from [facility]. Please contact [facility] at [phone] with questions or concerns regarding your invoice.   Our billing staff will not be able to assist you with questions regarding bills from these companies.  You will be contacted with the lab results as soon as they are available. The fastest way to get your results is to activate your My Chart account. Instructions are located on the last page of this paperwork. If you have not heard from us regarding the results in 2 weeks, please contact this office.    Subungual Hematoma A subungual hematoma is a pocket of blood that collects under the fingernail or toenail. The pressure created by the blood under the nail can cause pain. CAUSES  A subungual hematoma occurs when an injury to the finger or toe causes a blood vessel beneath the nail to break. The injury can occur from a direct blow such as slamming a finger in a door. It can also occur from a repeated injury such as pressure on the foot in a shoe while running. A subungual hematoma is sometimes called runner's toe or tennis toe. SYMPTOMS   Blue or dark blue skin under the nail.  Pain or throbbing in the injured area. DIAGNOSIS  Your caregiver can determine whether you have a subungual hematoma based on your history and a physical exam. If your caregiver thinks you might have a broken (fractured) bone, X-rays may be taken. TREATMENT  Hematomas usually go away on their own over time. Your caregiver may make a hole in the nail to drain the blood. Draining the blood is painless and usually provides significant relief from pain and throbbing. The nail usually grows back normally after this procedure. In some  cases, the nail may need to be removed. This is done if there is a cut under the nail that requires stitches (sutures). HOME CARE INSTRUCTIONS   Put ice on the injured area.  Put ice in a plastic bag.  Place a towel between your skin and the bag.  Leave the ice on for 15-20 minutes, 03-04 times a day for the first 1 to 2 days.  Elevate the injured area to help decrease pain and swelling.  If you were given a bandage, wear it for as long as directed by your caregiver.  If part of your nail falls off, trim the remaining nail gently. This prevents the nail from catching on something and causing further injury.  Only take over-the-counter or prescription medicines for pain, discomfort, or fever as directed by your caregiver. SEEK IMMEDIATE MEDICAL CARE IF:   You have redness or swelling around the nail.  You have yellowish-white fluid (pus) coming from the nail.  Your pain is not controlled with medicine.  You have a fever. MAKE SURE YOU:  Understand these instructions.  Will watch your condition.  Will get help right away if you are not doing well or get worse.   This information is not intended to replace advice given to you by your health care provider. Make sure you discuss any questions you have with your health care provider.   Document Released: 10/31/2000 Document Revised: 01/26/2012 Document Reviewed: 03/21/2015 Elsevier Interactive Patient Education [employer]2016 Elsevier Inc.

## 2016-04-22 ENCOUNTER — Ambulatory Visit (INDEPENDENT_AMBULATORY_CARE_PROVIDER_SITE_OTHER): Payer: BLUE CROSS/BLUE SHIELD | Admitting: Family Medicine

## 2016-04-22 VITALS — BP 104/64 | HR 81 | Temp 98.1°F | Resp 16 | Ht 70.0 in | Wt 134.0 lb

## 2016-04-22 DIAGNOSIS — R369 Urethral discharge, unspecified: Secondary | ICD-10-CM

## 2016-04-22 DIAGNOSIS — Z7251 High risk heterosexual behavior: Secondary | ICD-10-CM

## 2016-04-22 DIAGNOSIS — N342 Other urethritis: Secondary | ICD-10-CM

## 2016-04-22 MED ORDER — AZITHROMYCIN 250 MG PO TABS: 1000.0000 mg | ORAL_TABLET | Freq: Once | ORAL | Status: DC

## 2016-04-22 MED ORDER — CEFTRIAXONE SODIUM 1 G IJ SOLR
250.0000 mg | Freq: Once | INTRAMUSCULAR | Status: AC
  Administered 2016-04-22: 250 mg via INTRAMUSCULAR

## 2016-04-22 NOTE — Progress Notes (Signed)
By signing my name below I, Cole Wolfe, attest that this documentation has been prepared under the direction and in the presence of [name], MD. Electonically Signed. Cole Wolfe, Scribe 04/22/2016 at 4:36 PM   Subjective:    Patient ID: Cole Wolfe, male    DOB: 11/04/1996, 20 y.o.   MRN: [id number]  Chief Complaint  Patient presents with  . Penile Discharge    x 3-4 days, clear  . Dysuria    HPI Cole Wolfe is a 20 y.o. male who [facility] complaining of penile discharge for the past 4 days. Pt also c/o mild burning during urination. Pt denies any hematuria, testicular pain, fever, rash, or N/V/D.   Pt had similar discharge 2 years ago when pt had chlamydia. Pt had new sexual partner 2 weeks ago, no condom used.     There are no active problems to display for this patient.  History reviewed. No pertinent past medical history. Past Surgical History  Procedure Laterality Date  . Appendectomy     No Known Allergies Prior to Admission medications   Medication Sig Start Date End Date Taking? Authorizing Provider  EPINEPHrine 0.3 mg/0.3 mL IJ SOAJ injection Inject 0.3 mLs (0.3 mg total) into the muscle once. 12/05/15  Yes Ofilia Neas, PA-C   Social History   Social History  . Marital Status: Married    Spouse Name: N/A  . Number of Children: N/A  . Years of Education: N/A   Occupational History  . Not on file.   Social History Main Topics  . Smoking status: Current Every Day Smoker    Last Attempt to Quit: 03/16/2016  . Smokeless tobacco: Never Used  . Alcohol Use: No  . Drug Use: No  . Sexual Activity: No   Other Topics Concern  . Not on file   Social History Narrative      Review of Systems  Constitutional: Negative for fever.  Gastrointestinal: Negative for nausea, vomiting, abdominal pain and diarrhea.  Genitourinary: Positive for dysuria and discharge.  Skin: Negative for rash.         Objective:   Physical Exam  Constitutional: He is oriented to person, place, and time. He appears well-developed and well-nourished. No distress.  HENT:  Head: Normocephalic and atraumatic.  Eyes: Conjunctivae are normal. Pupils are equal, round, and reactive to light.  Neck: Neck supple.  Cardiovascular: Normal rate, regular rhythm and normal heart sounds.  Exam reveals no gallop and no friction rub.   No murmur heard. Pulmonary/Chest: Effort normal and breath sounds normal. No respiratory distress. He has no wheezes. He has no rales.  Abdominal: Soft. Normal appearance and bowel sounds are normal. There is no tenderness. There is no rebound, no guarding and no CVA tenderness.  Genitourinary: Testes normal. Right testis shows no mass and no tenderness. Left testis shows no mass and no tenderness. Uncircumcised. Discharge (small amount of clear white discharge from uretheral meatus) found.  Musculoskeletal: Normal range of motion.  Lymphadenopathy:       Right: Inguinal adenopathy present.       Left: Inguinal adenopathy present.  Neurological: He is alert and oriented to person, place, and time.  Skin: Skin is warm and dry.  Psychiatric: He has a normal mood and affect. His behavior is normal.  Nursing note and vitals reviewed.    Filed Vitals:   04/22/16 1457  BP: 104/64  Pulse: 81  Temp: 98.1  F (36.7 C)  Resp: 16  Height: 5\' 10"  (1.778 m)  Weight: 134 lb (60.782 kg)  SpO2: 100%         Assessment & Plan:   Cole ButtsJustin A Wolfe is a 20 y.o. male History of unprotected sex - Plan: GC/Chlamydia Probe Amp, HIV antibody, RPR, cefTRIAXone (ROCEPHIN) injection 250 mg, azithromycin (ZITHROMAX) 250 MG tablet  Infective urethritis - Plan: GC/Chlamydia Probe Amp, HIV antibody, RPR, cefTRIAXone (ROCEPHIN) injection 250 mg, azithromycin (ZITHROMAX) 250 MG tablet  Penile discharge - Plan: GC/Chlamydia Probe Amp, HIV antibody, RPR, cefTRIAXone (ROCEPHIN) injection 250 mg, azithromycin  (ZITHROMAX) 250 MG tablet  Suspected infectious urethritis. Check urine chlamydia and gonorrhea, HIV, RPR. Start azithromycin 1000 mg 1, and Rocephin 250 mg injection in office. Safer sex practice with condoms every time discussed, and advised to have partner checked as well. rtc precautions, and contact precautions until all are treated.  Meds ordered this encounter  Medications  . cefTRIAXone (ROCEPHIN) injection 250 mg    Sig:     Order Specific Question:  Antibiotic Indication:    Answer:  STD  . azithromycin (ZITHROMAX) 250 MG tablet    Sig: Take 4 tablets (1,000 mg total) by mouth once.    Dispense:  4 tablet    Refill:  0   Patient Instructions       IF you received an x-ray today, you will receive an invoice from [facility]. Please contact [facility] at [phone] with questions or concerns regarding your invoice.   IF you received labwork today, you will receive an invoice from [facility]. Please contact [facility] at [phone] with questions or concerns regarding your invoice.   Our billing staff will not be able to assist you with questions regarding bills from these companies.  You will be contacted with the lab results as soon as they are available. The fastest way to get your results is to activate your My Chart account. Instructions are located on the last page of this paperwork. If you have not heard from us regarding the results in 2 weeks, please contact this office.    Condoms every time as we discussed. I do recommend your partner be tested and/or treated. If any worsening of symptoms, return here or other medical provider.  Urethritis, Adult Urethritis is an inflammation of the tube through which urine exits your bladder (urethra).  CAUSES Urethritis is often caused by an infection in your urethra. The infection can be viral, like herpes. The infection can also be bacterial, like gonorrhea. RISK FACTORS Risk  factors of urethritis include:  Having sex without using a condom.  Having multiple sexual partners.  Having poor hygiene. SIGNS AND SYMPTOMS Symptoms of urethritis are less noticeable in women than in men. These symptoms include:  Burning feeling when you urinate (dysuria).  Discharge from your urethra.  Blood in your urine (hematuria).  Urinating more than usual. DIAGNOSIS  To confirm a diagnosis of urethritis, your health care provider will do the following:  Ask about your sexual history.  Perform a physical exam.  Have you provide a sample of your urine for lab testing.  Use a cotton swab to gently collect a sample from your urethra for lab testing. TREATMENT  It is important to treat urethritis. Depending on the cause, untreated urethritis may lead to serious genital infections and possibly infertility. Urethritis caused by a bacterial infection is treated with antibiotic medicine. All sexual partners must be treated.  HOME CARE INSTRUCTIONS  Do not have sex until the test results are known and treatment is completed, even if your symptoms go away before you finish treatment.  If you were prescribed an antibiotic, finish it all even if you start to feel better. SEEK MEDICAL CARE IF:   Your symptoms are not improved in 3 days.  Your symptoms are getting worse.  You develop abdominal pain or pelvic pain (in women).  You develop joint pain.  You have a fever. SEEK IMMEDIATE MEDICAL CARE IF:   You have severe pain in the belly, back, or side.  You have repeated vomiting. MAKE SURE YOU:  Understand these instructions.  Will watch your condition.  Will get help right away if you are not doing well or get worse.   This information is not intended to replace advice given to you by your health care provider. Make sure you discuss any questions you have with your health care provider.   Document Released: 04/29/2001 Document Revised: 03/20/2015 Document  Reviewed: 07/04/2013 Elsevier Interactive Patient Education [employer]     I personally performed the services described in this documentation, which was scribed in my presence. The recorded information has been reviewed and considered, and addended by me as needed.   Signed,   Meredith Staggers, MD [facility].  04/23/2016 8:22 AM

## 2016-04-22 NOTE — Patient Instructions (Addendum)
     IF you received an x-ray today, you will receive an invoice from [facility]. Please contact [facility] at [phone] with questions or concerns regarding your invoice.   IF you received labwork today, you will receive an invoice from [facility]. Please contact [facility] at [phone] with questions or concerns regarding your invoice.   Our billing staff will not be able to assist you with questions regarding bills from these companies.  You will be contacted with the lab results as soon as they are available. The fastest way to get your results is to activate your My Chart account. Instructions are located on the last page of this paperwork. If you have not heard from us regarding the results in 2 weeks, please contact this office.    Condoms every time as we discussed. I do recommend your partner be tested and/or treated. If any worsening of symptoms, return here or other medical provider.  Urethritis, Adult Urethritis is an inflammation of the tube through which urine exits your bladder (urethra).  CAUSES Urethritis is often caused by an infection in your urethra. The infection can be viral, like herpes. The infection can also be bacterial, like gonorrhea. RISK FACTORS Risk factors of urethritis include:  Having sex without using a condom.  Having multiple sexual partners.  Having poor hygiene. SIGNS AND SYMPTOMS Symptoms of urethritis are less noticeable in women than in men. These symptoms include:  Burning feeling when you urinate (dysuria).  Discharge from your urethra.  Blood in your urine (hematuria).  Urinating more than usual. DIAGNOSIS  To confirm a diagnosis of urethritis, your health care provider will do the following:  Ask about your sexual history.  Perform a physical exam.  Have you provide a sample of your urine for lab testing.  Use a cotton swab to gently collect a sample from your urethra for lab  testing. TREATMENT  It is important to treat urethritis. Depending on the cause, untreated urethritis may lead to serious genital infections and possibly infertility. Urethritis caused by a bacterial infection is treated with antibiotic medicine. All sexual partners must be treated.  HOME CARE INSTRUCTIONS  Do not have sex until the test results are known and treatment is completed, even if your symptoms go away before you finish treatment.  If you were prescribed an antibiotic, finish it all even if you start to feel better. SEEK MEDICAL CARE IF:   Your symptoms are not improved in 3 days.  Your symptoms are getting worse.  You develop abdominal pain or pelvic pain (in women).  You develop joint pain.  You have a fever. SEEK IMMEDIATE MEDICAL CARE IF:   You have severe pain in the belly, back, or side.  You have repeated vomiting. MAKE SURE YOU:  Understand these instructions.  Will watch your condition.  Will get help right away if you are not doing well or get worse.   This information is not intended to replace advice given to you by your health care provider. Make sure you discuss any questions you have with your health care provider.   Document Released: 04/29/2001 Document Revised: 03/20/2015 Document Reviewed: 07/04/2013 Elsevier Interactive Patient Education [employer] Elsevier Inc.

## 2016-04-23 LAB — HIV ANTIBODY (ROUTINE TESTING W REFLEX): HIV 1&2 Ab, 4th Generation: NONREACTIVE

## 2016-04-23 LAB — RPR

## 2016-04-23 LAB — GC/CHLAMYDIA PROBE AMP
CT PROBE, AMP APTIMA: NOT DETECTED
GC Probe RNA: NOT DETECTED

## 2016-05-02 ENCOUNTER — Telehealth: Payer: Self-pay

## 2016-05-02 NOTE — Telephone Encounter (Signed)
Patient request a copy of his lab results. Patient request to pick the copy up when available. [phone].

## 2016-05-02 NOTE — Telephone Encounter (Signed)
Patient notified that a copy of his labs are ready to be pick up.

## 2016-07-17 ENCOUNTER — Ambulatory Visit (INDEPENDENT_AMBULATORY_CARE_PROVIDER_SITE_OTHER): Payer: BLUE CROSS/BLUE SHIELD | Admitting: Physician Assistant

## 2016-07-17 ENCOUNTER — Encounter: Payer: Self-pay | Admitting: Physician Assistant

## 2016-07-17 VITALS — BP 110/68 | HR 72 | Temp 97.8°F | Resp 18 | Ht 70.0 in | Wt 131.0 lb

## 2016-07-17 DIAGNOSIS — N4889 Other specified disorders of penis: Secondary | ICD-10-CM | POA: Diagnosis not present

## 2016-07-17 LAB — POCT URINALYSIS DIP (MANUAL ENTRY)
BILIRUBIN UA: NEGATIVE
GLUCOSE UA: NEGATIVE
Ketones, POC UA: NEGATIVE
LEUKOCYTES UA: NEGATIVE
NITRITE UA: NEGATIVE
RBC UA: NEGATIVE
Spec Grav, UA: 1.02
UROBILINOGEN UA: 0.2
pH, UA: 6.5

## 2016-07-17 LAB — POC MICROSCOPIC URINALYSIS (UMFC): MUCUS RE: ABSENT

## 2016-07-17 LAB — POCT SKIN KOH: Skin KOH, POC: NEGATIVE

## 2016-07-17 MED ORDER — CLOTRIMAZOLE 1 % EX OINT: TOPICAL_OINTMENT | CUTANEOUS | 0 refills | Status: DC

## 2016-07-17 MED ORDER — MUPIROCIN 2 % EX OINT: 1.0000 "application " | TOPICAL_OINTMENT | Freq: Three times a day (TID) | CUTANEOUS | 1 refills | Status: DC

## 2016-07-17 NOTE — Patient Instructions (Signed)
     IF you received an x-ray today, you will receive an invoice from [facility]. Please contact [facility] at [phone] with questions or concerns regarding your invoice.   IF you received labwork today, you will receive an invoice from [facility]. Please contact [facility] at [phone] with questions or concerns regarding your invoice.   Our billing staff will not be able to assist you with questions regarding bills from these companies.  You will be contacted with the lab results as soon as they are available. The fastest way to get your results is to activate your My Chart account. Instructions are located on the last page of this paperwork. If you have not heard from us regarding the results in 2 weeks, please contact this office.      

## 2016-07-17 NOTE — Progress Notes (Signed)
07/17/2016 5:05 PM   DOB: 12-05-1995 / MRN: [id number]  SUBJECTIVE:  Cole Wolfe is a 20 y.o. male presenting for foreskin pain that started 4 days ago.  He is uncircumcised and has had similar problems in the past. He feels this is getting better. He denies any penile pain at this time, and only has pain if he tries to retract his foreskin.    He has No Known Allergies.   He  has no past medical history on file.    He  reports that he has been smoking.  He has never used smokeless tobacco. He reports that he does not drink alcohol or use drugs. He  reports that he does not engage in sexual activity. The patient  has a past surgical history that includes Appendectomy.  His family history includes Diabetes in his mother.  Review of Systems  Constitutional: Negative for chills and fever.  Cardiovascular: Negative for chest pain.  Gastrointestinal: Negative for nausea.  Genitourinary: Negative for dysuria.  Musculoskeletal: Negative for myalgias.  Skin: Negative for itching and rash.  Neurological: Negative for dizziness and headaches.  Psychiatric/Behavioral: Negative for depression.    The problem list and medications were reviewed and updated by myself where necessary and exist elsewhere in the encounter.   OBJECTIVE:  BP 110/68 (BP Location: Right Arm, Patient Position: Sitting, Cuff Size: Small)   Pulse 72   Temp 97.8 F (36.6 C) (Oral)   Resp 18   Ht 5\' 10"  (1.778 m)   Wt 131 lb (59.4 kg)   SpO2 99%   BMI 18.80 kg/m   Physical Exam  Constitutional: He is oriented to person, place, and time. He appears well-developed and well-nourished. No distress.  HENT:  Mouth/Throat: No oropharyngeal exudate.  Cardiovascular: Normal rate.   Pulmonary/Chest: Effort normal and breath sounds normal.  Abdominal: Soft. Bowel sounds are normal.  Genitourinary: Testes normal.    No phimosis, paraphimosis, hypospadias or penile erythema. No discharge found.  Musculoskeletal:  Normal range of motion.  Neurological: He is alert and oriented to person, place, and time.  Skin: Skin is warm and dry. He is not diaphoretic.  Psychiatric: He has a normal mood and affect.    Results for orders placed or performed in visit on 07/17/16 (from the past 72 hour(s))  POCT Skin KOH     Status: None   Collection Time: 07/17/16  4:32 PM  Result Value Ref Range   Skin KOH, POC Negative   POCT urinalysis dipstick     Status: Abnormal   Collection Time: 07/17/16  4:32 PM  Result Value Ref Range   Color, UA yellow yellow   Clarity, UA clear clear   Glucose, UA negative negative   Bilirubin, UA negative negative   Ketones, POC UA negative negative   Spec Grav, UA 1.020    Blood, UA negative negative   pH, UA 6.5    Protein Ur, POC trace (A) negative   Urobilinogen, UA 0.2    Nitrite, UA Negative Negative   Leukocytes, UA Negative Negative  POCT Microscopic Urinalysis ([facility])     Status: None   Collection Time: 07/17/16  4:44 PM  Result Value Ref Range   WBC,UR,HPF,POC None None WBC/hpf   RBC,UR,HPF,POC None None RBC/hpf   Bacteria None None, Too numerous to count   Mucus Absent Absent   Epithelial Cells, UR Per Microscopy None None, Too numerous to count cells/hpf    No results found.  ASSESSMENT AND PLAN  Williams was seen today for groin swelling.  Diagnoses and all orders for this visit:  Foreskin fissure: He is able to retract his foreskin. There is no paraphimosis.  I gave him mupirocin and clotrimazole ointment and adivsed he apply this copiously bid.  Advised that if he develops paraphimosis to go directly to the ED.   -     POCT Skin KOH -     POCT urinalysis dipstick -     POCT Microscopic Urinalysis ([facility]) -     GC/Chlamydia Probe Amp    The patient is advised to call or return to clinic if he does not see an improvement in symptoms, or to seek the care of the closest emergency department if he worsens with the above plan.   Deliah Boston, MHS,  PA-C [facility] 07/17/2016 5:05 PM

## 2016-07-18 LAB — GC/CHLAMYDIA PROBE AMP
CT PROBE, AMP APTIMA: NOT DETECTED
GC Probe RNA: NOT DETECTED

## 2016-07-28 ENCOUNTER — Ambulatory Visit (INDEPENDENT_AMBULATORY_CARE_PROVIDER_SITE_OTHER): Payer: BLUE CROSS/BLUE SHIELD | Admitting: Physician Assistant

## 2016-07-28 VITALS — BP 112/74 | HR 81 | Temp 97.8°F | Resp 17 | Ht 70.0 in | Wt 132.0 lb

## 2016-07-28 DIAGNOSIS — N4889 Other specified disorders of penis: Secondary | ICD-10-CM

## 2016-07-28 DIAGNOSIS — N489 Disorder of penis, unspecified: Secondary | ICD-10-CM

## 2016-07-28 MED ORDER — VALACYCLOVIR HCL 1 G PO TABS: 1000.0000 mg | ORAL_TABLET | Freq: Two times a day (BID) | ORAL | 3 refills | Status: DC

## 2016-07-28 NOTE — Progress Notes (Signed)
07/28/2016 4:22 PM   DOB: 03/13/1996 / MRN: [id number]  SUBJECTIVE:  Cole Wolfe is a 20 y.o. male presenting for penile lesion that I evaluated about 4 days ago. Reports to me today that his girlfriend recently tested positive for HSV2 antibodies.  He is concerned his penile lesion is this and not a tear, which we had suspected previously.  He reports feeling better today than last I saw him.   He has No Known Allergies.   He  has no past medical history on file.    He  reports that he quit smoking about 4 months ago. He has never used smokeless tobacco. He reports that he does not drink alcohol or use drugs. He  reports that he does not engage in sexual activity. The patient  has a past surgical history that includes Appendectomy.  His family history includes Diabetes in his mother.  Review of Systems  Constitutional: Negative for chills and fever.  Respiratory: Negative for cough.   Gastrointestinal: Negative for nausea.  Skin: Positive for rash. Negative for itching.  Neurological: Negative for dizziness and headaches.    The problem list and medications were reviewed and updated by myself where necessary and exist elsewhere in the encounter.   OBJECTIVE:  BP 112/74 (BP Location: Right Arm, Patient Position: Sitting, Cuff Size: Normal)   Pulse 81   Temp 97.8 F (36.6 C) (Oral)   Resp 17   Ht 5\' 10"  (1.778 m)   Wt 132 lb (59.9 kg)   SpO2 100%   BMI 18.94 kg/m   Physical Exam  Constitutional: He appears well-developed and well-nourished.  Cardiovascular: Normal rate.   Pulmonary/Chest: Effort normal.  Genitourinary:     Musculoskeletal: Normal range of motion.    Recent Results (from the past 2160 hour(s))  GC/Chlamydia Probe Amp     Status: None   Collection Time: 07/17/16  4:20 PM  Result Value Ref Range   CT Probe RNA NOT DETECTED     Comment:                    **Normal Reference Range: NOT DETECTED**   This test was performed using the APTIMA COMBO2  Assay ([employer]   The analytical performance characteristics of this assay, when used to test SurePath specimens have been determined by [facility]      GC Probe RNA NOT DETECTED     Comment:                    **Normal Reference Range: NOT DETECTED**   This test was performed using the APTIMA COMBO2 Assay ([employer]   The analytical performance characteristics of this assay, when used to test SurePath specimens have been determined by [facility]     POCT Skin KOH     Status: None   Collection Time: 07/17/16  4:32 PM  Result Value Ref Range   Skin KOH, POC Negative   POCT urinalysis dipstick     Status: Abnormal   Collection Time: 07/17/16  4:32 PM  Result Value Ref Range   Color, UA yellow yellow   Clarity, UA clear clear   Glucose, UA negative negative   Bilirubin, UA negative negative   Ketones, POC UA negative negative   Spec Grav, UA 1.020    Blood, UA negative negative   pH, UA 6.5    Protein Ur, POC trace (A) negative   Urobilinogen, UA 0.2    Nitrite,  UA Negative Negative   Leukocytes, UA Negative Negative  POCT Microscopic Urinalysis ([facility])     Status: None   Collection Time: 07/17/16  4:44 PM  Result Value Ref Range   WBC,UR,HPF,POC None None WBC/hpf   RBC,UR,HPF,POC None None RBC/hpf   Bacteria None None, Too numerous to count   Mucus Absent Absent   Epithelial Cells, UR Per Microscopy None None, Too numerous to count cells/hpf    No results found for this or any previous visit (from the past 72 hour(s)).  No results found.    ASSESSMENT AND PLAN  Cole Wolfe was seen today for follow-up.  Diagnoses and all orders for this visit:  Penile lesion: I have cultured the lesion.  Will go ahead and treat given that his girlfriend has also been treated. HIV and syphilis tests both negative about three months ago.  -     Herpes simplex virus culture -     valACYclovir (VALTREX) 1000 MG tablet; Take 1 tablet (1,000 mg total) by  mouth 2 (two) times daily. Take four 4 days only.    The patient is advised to call or return to clinic if he does not see an improvement in symptoms, or to seek the care of the closest emergency department if he worsens with the above plan.   Deliah Boston, MHS, PA-C [facility] 07/28/2016 4:22 PM

## 2016-07-28 NOTE — Patient Instructions (Signed)
     IF you received an x-ray today, you will receive an invoice from [facility]. Please contact [facility] at [phone] with questions or concerns regarding your invoice.   IF you received labwork today, you will receive an invoice from [facility]. Please contact [facility] at [phone] with questions or concerns regarding your invoice.   Our billing staff will not be able to assist you with questions regarding bills from these companies.  You will be contacted with the lab results as soon as they are available. The fastest way to get your results is to activate your My Chart account. Instructions are located on the last page of this paperwork. If you have not heard from us regarding the results in 2 weeks, please contact this office.      

## 2016-07-30 LAB — HERPES SIMPLEX VIRUS CULTURE: ORGANISM ID, BACTERIA: DETECTED

## 2016-08-01 NOTE — Progress Notes (Signed)
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  This may be the only outbreak he ever has, or he may have multiple.  He should keep a prescription for Valtrex and if he ever has any symptoms (tingling, burning) he should start the medication and take as prescribed on the bottle.

## 2016-08-13 ENCOUNTER — Encounter: Payer: Self-pay | Admitting: *Deleted

## 2018-02-02 ENCOUNTER — Ambulatory Visit (HOSPITAL_COMMUNITY)
Admission: EM | Admit: 2018-02-02 | Discharge: 2018-02-02 | Disposition: A | Discharge status: Home / Self Care | Payer: BLUE CROSS/BLUE SHIELD | Attending: Family Medicine | Admitting: Family Medicine

## 2018-02-02 ENCOUNTER — Other Ambulatory Visit: Payer: Self-pay

## 2018-02-02 ENCOUNTER — Encounter (HOSPITAL_COMMUNITY): Payer: Self-pay | Admitting: Emergency Medicine

## 2018-02-02 DIAGNOSIS — K529 Noninfective gastroenteritis and colitis, unspecified: Secondary | ICD-10-CM

## 2018-02-02 MED ORDER — ONDANSETRON 4 MG PO TBDP
ORAL_TABLET | ORAL | Status: AC
  Filled 2018-02-02: qty 1

## 2018-02-02 MED ORDER — ONDANSETRON 4 MG PO TBDP
4.0000 mg | ORAL_TABLET | Freq: Once | ORAL | Status: AC
  Administered 2018-02-02: 4 mg via ORAL

## 2018-02-02 MED ORDER — ONDANSETRON HCL 4 MG PO TABS: 4.0000 mg | ORAL_TABLET | Freq: Three times a day (TID) | ORAL | 0 refills | Status: DC | PRN

## 2018-02-02 NOTE — ED Triage Notes (Signed)
Vomiting started late Sunday night and diarrhea followed.  One episode of vomiting today.  patient reports one episode of diarrhea today.  Patient complains of general aches

## 2018-02-02 NOTE — Discharge Instructions (Signed)
Increase fluid intake, small frequent sips. Gatorade, pedialyte etc. Bland diet, advance as tolerated.  Zofran as needed. If symptoms worsen or do not improve in the next week to return to be seen or to follow up with your PCP.

## 2018-02-02 NOTE — ED Provider Notes (Signed)
[facility]    CSN: [id number] Arrival date & time: 02/02/18  1459     History   Chief Complaint Chief Complaint  Patient presents with  . Emesis    HPI Cole Wolfe is a 22 y.o. male.   Cole Wolfe presents with complaints of nausea, vomiting, diarrhea which started after eating out to dinner two nights ago. Vomiting started first a few hours after eating. Yesterday morning with body aches, diarrhea and vomiting. Has been taking liquids. Last episode of emesis was this morning and last diarrhea this morning. Feeling improved. No known fevers. Without abdominal pain. No others ill. Without blood to vomit or stool. Urinating. Without contributing medical history.   ROS per HPI.       History reviewed. No pertinent past medical history.  There are no active problems to display for this patient.   Past Surgical History:  Procedure Laterality Date  . APPENDECTOMY         Home Medications    Prior to Admission medications   Medication Sig Start Date End Date Taking? Authorizing Provider  Clotrimazole 1 % OINT Apply twice daily. 07/17/16   Ofilia Neaslark, Michael L, PA-C  mupirocin ointment (BACTROBAN) 2 % Apply 1 application topically 3 (three) times daily. 07/17/16   Ofilia Neaslark, Michael L, PA-C  ondansetron (ZOFRAN) 4 MG tablet Take 1 tablet (4 mg total) by mouth every 8 (eight) hours as needed for nausea or vomiting. 02/02/18   Georgetta HaberBurky, Kennedy Bohanon B, NP  valACYclovir (VALTREX) 1000 MG tablet Take 1 tablet (1,000 mg total) by mouth 2 (two) times daily. Take four 4 days only. 07/28/16   Ofilia Neaslark, Michael L, PA-C    Family History Family History  Problem Relation Age of Onset  . Diabetes Mother     Social History Social History   Tobacco Use  . Smoking status: Former Smoker    Last attempt to quit: 03/16/2016    Years since quitting: 1.8  . Smokeless tobacco: Never Used  Substance Use Topics  . Alcohol use: No    Alcohol/week: 0.0 oz  . Drug use: No     Allergies     Patient has no known allergies.   Review of Systems Review of Systems   Physical Exam Triage Vital Signs ED Triage Vitals  Enc Vitals Group     BP 02/02/18 1524 115/69     Pulse Rate 02/02/18 1524 92     Resp 02/02/18 1524 18     Temp 02/02/18 1524 (!) 97.5 F (36.4 C)     Temp Source 02/02/18 1524 Oral     SpO2 02/02/18 1524 98 %     Weight --      Height --      Head Circumference --      Peak Flow --      Pain Score 02/02/18 1522 4     Pain Loc --      Pain Edu? --      Excl. in GC? --    No data found.  Updated Vital Signs BP 115/69 (BP Location: Left Arm)   Pulse 92   Temp (!) 97.5 F (36.4 C) (Oral)   Resp 18   SpO2 98%   Visual Acuity Right Eye Distance:   Left Eye Distance:   Bilateral Distance:    Right Eye Near:   Left Eye Near:    Bilateral Near:     Physical Exam  Constitutional: He is oriented to person, place, and time.  He appears well-developed and well-nourished.  Cardiovascular: Normal rate and regular rhythm.  Pulmonary/Chest: Effort normal and breath sounds normal.  Abdominal: Soft. He exhibits no distension and no mass. There is no tenderness. There is no rebound and no guarding. No hernia.  Neurological: He is alert and oriented to person, place, and time.  Skin: Skin is warm and dry.     UC Treatments / Results  Labs (all labs ordered are listed, but only abnormal results are displayed) Labs Reviewed - No data to display  EKG  EKG Interpretation None       Radiology No results found.  Procedures Procedures (including critical care time)  Medications Ordered in UC Medications  ondansetron (ZOFRAN-ODT) disintegrating tablet 4 mg (4 mg Oral Given 02/02/18 1541)     Initial Impression / Assessment and Plan / UC Course  I have reviewed the triage vital signs and the nursing notes.  Pertinent labs & imaging results that were available during my care of the patient were reviewed by me and considered in my medical  decision making (see chart for details).     Non toxic in appearance. Without acute abdominal findings. Symptoms improving. History and physical consistent with viral illness.  Supportive cares recommended. zofran as needed. Bland diet. Push fluids. Return precautions provided. Patient verbalized understanding and agreeable to plan.    Final Clinical Impressions(s) / UC Diagnoses   Final diagnoses:  Gastroenteritis    ED Discharge Orders        Ordered    ondansetron (ZOFRAN) 4 MG tablet  Every 8 hours PRN     02/02/18 1555       Controlled Substance Prescriptions [place] Controlled Substance Registry consulted? Not Applicable   Georgetta Haber, NP 02/02/18 1600

## 2018-02-03 ENCOUNTER — Ambulatory Visit: Payer: BLUE CROSS/BLUE SHIELD | Admitting: Physician Assistant

## 2018-03-13 ENCOUNTER — Ambulatory Visit: Payer: BLUE CROSS/BLUE SHIELD | Admitting: Family Medicine

## 2018-04-30 ENCOUNTER — Other Ambulatory Visit: Payer: Self-pay

## 2018-04-30 ENCOUNTER — Ambulatory Visit: Payer: BLUE CROSS/BLUE SHIELD | Admitting: Urgent Care

## 2018-04-30 ENCOUNTER — Encounter: Payer: Self-pay | Admitting: Urgent Care

## 2018-04-30 VITALS — BP 108/68 | HR 77 | Temp 98.0°F | Resp 16 | Ht 70.0 in | Wt 132.0 lb

## 2018-04-30 DIAGNOSIS — L299 Pruritus, unspecified: Secondary | ICD-10-CM | POA: Diagnosis not present

## 2018-04-30 DIAGNOSIS — L739 Follicular disorder, unspecified: Secondary | ICD-10-CM

## 2018-04-30 MED ORDER — HYDROXYZINE PAMOATE 25 MG PO CAPS: 25.0000 mg | ORAL_CAPSULE | Freq: Every evening | ORAL | 0 refills | Status: DC | PRN

## 2018-04-30 MED ORDER — MUPIROCIN 2 % EX OINT: 1.0000 | TOPICAL_OINTMENT | Freq: Three times a day (TID) | CUTANEOUS | 1 refills | Status: DC

## 2018-04-30 NOTE — Progress Notes (Signed)
   MRN: [id number] DOB: 03/10/1996  Subjective:   Cole Wolfe is a 22 y.o. male presenting for 1 year history of persistent and intermittent rash over his beard.  Patient reports that he continues to get bumps over areas that he shaves.  He is required to do this by his work, works in [employer].  He has used both clippers and razors and both have caused this particular problem.  Denies active fever, redness, drainage of pus or bleeding.  He does admit that the bumps on uncomfortable.  He is requesting a work note to help him manage his facial hair without being required to shave.  Cole Wolfe is not currently taking any medications and has No Known Allergies.  Cole Wolfe denies past medical history.  Also  has a past surgical history that includes Appendectomy.  Objective:   Vitals: BP 108/68   Pulse 77   Temp 98 F (36.7 C) (Oral)   Resp 16   Ht 5\' 10"  (1.778 m)   Wt 132 lb (59.9 kg)   SpO2 99%   BMI 18.94 kg/m   Physical Exam  Constitutional: He is oriented to person, place, and time. He appears well-developed and well-nourished.  HENT:  Head:    Cardiovascular: Normal rate.  Pulmonary/Chest: Effort normal.  Neurological: He is alert and oriented to person, place, and time.   Assessment and Plan :   Folliculitis  Itching - Plan: mupirocin ointment (BACTROBAN) 2 %  Patient's rash is not severe enough to require oral antibiotics, he is agreeable to trying Bactroban ointment which she has used successfully in the past.  Counseled on maintaining the equipment he uses to shave.  Provided patient with work note in an attempt to help him recover from his current folliculitis and avoid recurrence of symptoms.  Follow-up if symptoms persist.  Wallis BambergMario Fedor Kazmierski, PA-C [facility] [phone] 04/30/2018  2:57 PM

## 2018-04-30 NOTE — Patient Instructions (Addendum)
Folliculitis Folliculitis is inflammation of the hair follicles. Folliculitis most commonly occurs on the scalp, thighs, legs, back, and buttocks. However, it can occur anywhere on the body. What are the causes? This condition may be caused by:  A bacterial infection (common).  A fungal infection.  A viral infection.  Coming into contact with certain chemicals, especially oils and tars.  Shaving or waxing.  Applying greasy ointments or creams to your skin often.  Long-lasting folliculitis and folliculitis that keeps coming back can be caused by bacteria that live in the nostrils. What increases the risk? This condition is more likely to develop in people with:  A weakened immune system.  Diabetes.  Obesity.  What are the signs or symptoms? Symptoms of this condition include:  Redness.  Soreness.  Swelling.  Itching.  Small white or yellow, pus-filled, itchy spots (pustules) that appear over a reddened area. If there is an infection that goes deep into the follicle, these may develop into a boil (furuncle).  A group of closely packed boils (carbuncle). These tend to form in hairy, sweaty areas of the body.  How is this diagnosed? This condition is diagnosed with a skin exam. To find what is causing the condition, your health care provider may take a sample of one of the pustules or boils for testing. How is this treated? This condition may be treated by:  Applying warm compresses to the affected areas.  Taking an antibiotic medicine or applying an antibiotic medicine to the skin.  Applying or bathing with an antiseptic solution.  Taking an over-the-counter medicine to help with itching.  Having a procedure to drain any pustules or boils. This may be done if a pustule or boil contains a lot of pus or fluid.  Laser hair removal. This may be done to treat long-lasting folliculitis.  Follow these instructions at home:  If directed, apply heat to the affected  area as often as told by your health care provider. Use the heat source that your health care provider recommends, such as a moist heat pack or a heating pad. ? Place a towel between your skin and the heat source. ? Leave the heat on for 20-30 minutes. ? Remove the heat if your skin turns bright red. This is especially important if you are unable to feel pain, heat, or cold. You may have a greater risk of getting burned.  If you were prescribed an antibiotic medicine, use it as told by your health care provider. Do not stop using the antibiotic even if you start to feel better.  Take over-the-counter and prescription medicines only as told by your health care provider.  Do not shave irritated skin.  Keep all follow-up visits as told by your health care provider. This is important. Get help right away if:  You have more redness, swelling, or pain in the affected area.  Red streaks are spreading from the affected area.  You have a fever. This information is not intended to replace advice given to you by your health care provider. Make sure you discuss any questions you have with your health care provider. Document Released: 01/12/2002 Document Revised: 05/23/2016 Document Reviewed: 08/24/2015 Elsevier Interactive Patient Education  2018 [employer]     IF you received an x-ray today, you will receive an invoice from [facility]. Please contact [facility] at [phone] with questions or concerns regarding your invoice.   IF you received labwork today, you will receive an invoice from [facility]. Please contact [facility]  at [phone] with questions or concerns regarding your invoice.   Our billing staff will not be able to assist you with questions regarding bills from these companies.  You will be contacted with the lab results as soon as they are available. The fastest way to get your results is to activate your My Chart account. Instructions are located on the  last page of this paperwork. If you have not heard from us regarding the results in 2 weeks, please contact this office.

## 2018-05-28 ENCOUNTER — Other Ambulatory Visit: Payer: Self-pay | Admitting: Urgent Care

## 2018-05-28 NOTE — Telephone Encounter (Signed)
LOV  04/30/18 Cole Wolfe Filled 04/30/18 # 30 with 0 refill

## 2018-06-01 ENCOUNTER — Other Ambulatory Visit: Payer: Self-pay | Admitting: Urgent Care

## 2018-06-01 NOTE — Telephone Encounter (Signed)
Refill request for hydroxyzine 25 mg cap  LOV  04/30/18 temporary medication?  Wallis BambergMario Mani, [place]

## 2021-02-09 ENCOUNTER — Other Ambulatory Visit: Payer: Self-pay

## 2021-02-09 ENCOUNTER — Ambulatory Visit
Admission: EM | Admit: 2021-02-09 | Discharge: 2021-02-09 | Disposition: A | Discharge status: Home / Self Care | Payer: BC Managed Care – PPO | Attending: Internal Medicine | Admitting: Internal Medicine

## 2021-02-09 ENCOUNTER — Encounter: Payer: Self-pay | Admitting: Emergency Medicine

## 2021-02-09 DIAGNOSIS — L03317 Cellulitis of buttock: Secondary | ICD-10-CM

## 2021-02-09 DIAGNOSIS — L0231 Cutaneous abscess of buttock: Secondary | ICD-10-CM | POA: Diagnosis not present

## 2021-02-09 MED ORDER — DOXYCYCLINE HYCLATE 100 MG PO CAPS: 100.0000 mg | ORAL_CAPSULE | Freq: Two times a day (BID) | ORAL | 0 refills | Status: AC

## 2021-02-09 MED ORDER — IBUPROFEN 600 MG PO TABS: 600.0000 mg | ORAL_TABLET | Freq: Four times a day (QID) | ORAL | 0 refills | Status: DC | PRN

## 2021-02-09 NOTE — ED Provider Notes (Signed)
[facility]    CSN: [id number] Arrival date & time: 02/09/21  0917      History   Chief Complaint Chief Complaint  Patient presents with  . Abscess    HPI Cole Wolfe is a 25 y.o. male comes to the urgent care with a 3-day history of painful swelling in the left buttocks.  Symptoms started 3 days ago and has been persistent.  No drainage.  He has noticed some redness around the swelling.  No trauma to the gluteal area.  No fever or chills.  Patient denies history of recurrent abscesses.   HPI  History reviewed. No pertinent past medical history.  There are no problems to display for this patient.   Past Surgical History:  Procedure Laterality Date  . APPENDECTOMY         Home Medications    Prior to Admission medications   Medication Sig Start Date End Date Taking? Authorizing Provider  doxycycline (VIBRAMYCIN) 100 MG capsule Take 1 capsule (100 mg total) by mouth 2 (two) times daily for 7 days. 02/09/21 02/16/21 Yes Vincie Linn, Britta Mccreedy, MD  ibuprofen (ADVIL) 600 MG tablet Take 1 tablet (600 mg total) by mouth every 6 (six) hours as needed. 02/09/21  Yes Krimson Massmann, Britta Mccreedy, MD  hydrOXYzine (VISTARIL) 25 MG capsule Take 1 capsule (25 mg total) by mouth at bedtime as needed for itching. 04/30/18   Wallis Bamberg, PA-C  mupirocin ointment (BACTROBAN) 2 % Apply 1 application topically 3 (three) times daily. 04/30/18   Wallis Bamberg, PA-C    Family History Family History  Problem Relation Age of Onset  . Diabetes Mother     Social History Social History   Tobacco Use  . Smoking status: Former Smoker    Quit date: 03/16/2016    Years since quitting: 4.9  . Smokeless tobacco: Never Used  Substance Use Topics  . Alcohol use: No    Alcohol/week: 0.0 standard drinks  . Drug use: No     Allergies   Patient has no known allergies.   Review of Systems Review of Systems  Gastrointestinal: Negative.   Skin: Positive for color change and wound. Negative for  pallor.  Neurological: Negative.      Physical Exam Triage Vital Signs ED Triage Vitals [02/09/21 0926]  Enc Vitals Group     BP 125/74     Pulse Rate 69     Resp 16     Temp      Temp Source Oral     SpO2 98 %     Weight      Height      Head Circumference      Peak Flow      Pain Score 6     Pain Loc      Pain Edu?      Excl. in GC?    No data found.  Updated Vital Signs BP 125/74 (BP Location: Right Arm)   Pulse 69   Resp 16   SpO2 98%   Visual Acuity Right Eye Distance:   Left Eye Distance:   Bilateral Distance:    Right Eye Near:   Left Eye Near:    Bilateral Near:     Physical Exam Vitals and nursing note reviewed.  Constitutional:      General: He is not in acute distress.    Appearance: He is not ill-appearing.  Cardiovascular:     Rate and Rhythm: Normal rate and regular rhythm.  Pulses: Normal pulses.  Skin:    General: Skin is warm.     Findings: Erythema present.     Comments: Tender swelling on the left buttocks of with surrounding erythema.  Indurated area is about 2 inches long in the widest diameter.  No fluctuance.  Neurological:     Mental Status: He is alert.      UC Treatments / Results  Labs (all labs ordered are listed, but only abnormal results are displayed) Labs Reviewed - No data to display  EKG   Radiology No results found.  Procedures Procedures (including critical care time)  Medications Ordered in UC Medications - No data to display  Initial Impression / Assessment and Plan / UC Course  I have reviewed the triage vital signs and the nursing notes.  Pertinent labs & imaging results that were available during my care of the patient were reviewed by me and considered in my medical decision making (see chart for details).     1.  Immature abscess with surrounding cellulitis: Needle aspiration yielded about 0.5 mL of bloody pus Doxycycline 100 mg twice daily Warm compresses Ibuprofen 600 mg every 6  hours as needed for pain Return to urgent care if you develop increased drainage, worsening pain and swelling, fever or chills. Final Clinical Impressions(s) / UC Diagnoses   Final diagnoses:  Cellulitis and abscess of buttock     Discharge Instructions     Warm compresses 1-2 times daily Take antibiotics as directed If you have worsening pain, swelling, redness, fever or chills please return to the urgent care to be reevaluated. You may still develop an abscess and if that happens you might need I&D done.   ED Prescriptions    Medication Sig Dispense Auth. Provider   doxycycline (VIBRAMYCIN) 100 MG capsule Take 1 capsule (100 mg total) by mouth 2 (two) times daily for 7 days. 14 capsule Kellsey Sansone, Britta Mccreedy, MD   ibuprofen (ADVIL) 600 MG tablet Take 1 tablet (600 mg total) by mouth every 6 (six) hours as needed. 30 tablet Fareeha Evon, Britta Mccreedy, MD     PDMP not reviewed this encounter.   Merrilee Jansky, MD 02/09/21 [phone]

## 2021-02-09 NOTE — ED Triage Notes (Signed)
Pt said on left butt cheek there is a knot and very sore to sit on and red and swollen. Been there x 3 days.

## 2021-02-09 NOTE — Discharge Instructions (Addendum)
Warm compresses 1-2 times daily Take antibiotics as directed If you have worsening pain, swelling, redness, fever or chills please return to the urgent care to be reevaluated. You may still develop an abscess and if that happens you might need I&D done.

## 2024-09-16 ENCOUNTER — Ambulatory Visit (HOSPITAL_BASED_OUTPATIENT_CLINIC_OR_DEPARTMENT_OTHER): Admitting: Family Medicine

## 2024-09-16 ENCOUNTER — Encounter (HOSPITAL_BASED_OUTPATIENT_CLINIC_OR_DEPARTMENT_OTHER): Payer: Self-pay | Admitting: Family Medicine

## 2024-09-16 VITALS — BP 107/62 | HR 68 | Temp 98.7°F | Ht 70.0 in | Wt 181.0 lb

## 2024-09-16 DIAGNOSIS — Z23 Encounter for immunization: Secondary | ICD-10-CM

## 2024-09-16 DIAGNOSIS — Z113 Encounter for screening for infections with a predominantly sexual mode of transmission: Secondary | ICD-10-CM

## 2024-09-16 DIAGNOSIS — Z0000 Encounter for general adult medical examination without abnormal findings: Secondary | ICD-10-CM | POA: Diagnosis not present

## 2024-09-16 NOTE — Assessment & Plan Note (Signed)

## 2024-09-16 NOTE — Progress Notes (Signed)
 New Patient Office Visit  Subjective   Patient ID: Cole Wolfe, male    DOB: 02-18-1996  Age: 28 y.o. MRN: [id number]  CC:  Chief Complaint  Patient presents with   Establish Care    HPI Cole Wolfe presents to establish care Last PCP - none recently  Patient does not have any concerns today.  Denies any significant past medical issues.  No current medications. Last visit to a dentist was about a year or a year and a half ago.  He does note that his father is having evaluation of his prostate.  Thinks that he will be having an upcoming biopsy.  Uncertain what exactly led to biopsy being arranged, but thinks it was related to initial lab testing/screening.  Patient is originally from [place]. He works driving trucks, has been doing this for about 2 years. He does not do much outside of work.  Outpatient Encounter Medications as of 09/16/2024  Medication Sig   [DISCONTINUED] hydrOXYzine  (VISTARIL ) 25 MG capsule Take 1 capsule (25 mg total) by mouth at bedtime as needed for itching.   [DISCONTINUED] ibuprofen  (ADVIL ) 600 MG tablet Take 1 tablet (600 mg total) by mouth every 6 (six) hours as needed.   [DISCONTINUED] mupirocin  ointment (BACTROBAN ) 2 % Apply 1 application topically 3 (three) times daily.   No facility-administered encounter medications on file as of 09/16/2024.    History reviewed. No pertinent past medical history.  Past Surgical History:  Procedure Laterality Date   APPENDECTOMY      Family History  Problem Relation Age of Onset   Diabetes Mother    High Cholesterol Father     Social History   Socioeconomic History   Marital status: Married    Spouse name: Not on file   Number of children: Not on file   Years of education: Not on file   Highest education level: Some college, no degree  Occupational History   Not on file  Tobacco Use   Smoking status: Former    Current packs/day: 0.00    Types: Cigarettes    Quit date: 03/16/2016     Years since quitting: 8.5   Smokeless tobacco: Never  Substance and Sexual Activity   Alcohol use: No    Alcohol/week: 0.0 standard drinks of alcohol   Drug use: No   Sexual activity: Never  Other Topics Concern   Not on file  Social History Narrative   Not on file   Social Drivers of Health   Financial Resource Strain: Medium Risk (09/12/2024)   Overall Financial Resource Strain (CARDIA)    Difficulty of Paying Living Expenses: Somewhat hard  Food Insecurity: No Food Insecurity (09/12/2024)   Hunger Vital Sign    Worried About Running Out of Food in the Last Year: Never true    Ran Out of Food in the Last Year: Never true  Transportation Needs: No Transportation Needs (09/12/2024)   PRAPARE - Administrator, Civil Service (Medical): No    Lack of Transportation (Non-Medical): No  Physical Activity: Sufficiently Active (09/12/2024)   Exercise Vital Sign    Days of Exercise per Week: 5 days    Minutes of Exercise per Session: 150+ min  Stress: No Stress Concern Present (09/12/2024)   [employer] of Occupational Health - Occupational Stress Questionnaire    Feeling of Stress: Not at all  Social Connections: Moderately Isolated (09/12/2024)   Social Connection and Isolation Panel    Frequency of Communication with  Friends and Family: More than three times a week    Frequency of Social Gatherings with Friends and Family: Once a week    Attends Religious Services: Never    Database Administrator or Organizations: No    Attends Engineer, Structural: Not on file    Marital Status: Living with partner  Intimate Partner Violence: Not on file    Objective   BP 107/62 (BP Location: Right Arm, Patient Position: Sitting, Cuff Size: Normal)   Pulse 68   Temp 98.7 F (37.1 C) (Oral)   Ht 5' 10 (1.778 m)   Wt 181 lb (82.1 kg)   SpO2 98%   BMI 25.97 kg/m   Physical Exam Constitutional:      General: He is not in acute distress.    Appearance: He is  normal weight.  HENT:     Head: Normocephalic and atraumatic.     Right Ear: Tympanic membrane, ear canal and external ear normal.     Left Ear: Tympanic membrane, ear canal and external ear normal.     Nose: Nose normal.     Mouth/Throat:     Mouth: Mucous membranes are moist.     Pharynx: Oropharynx is clear.  Eyes:     Extraocular Movements: Extraocular movements intact.     Conjunctiva/sclera: Conjunctivae normal.     Pupils: Pupils are equal, round, and reactive to light.  Cardiovascular:     Rate and Rhythm: Normal rate and regular rhythm.     Pulses: Normal pulses.     Heart sounds: Normal heart sounds. No murmur heard. Pulmonary:     Effort: Pulmonary effort is normal.     Breath sounds: Normal breath sounds. No wheezing.  Abdominal:     General: Bowel sounds are normal. There is no distension.     Palpations: Abdomen is soft.     Tenderness: There is no abdominal tenderness. There is no guarding.  Musculoskeletal:     Cervical back: Normal range of motion. No tenderness.  Skin:    General: Skin is warm.     Capillary Refill: Capillary refill takes less than 2 seconds.     Coloration: Skin is not jaundiced.     Findings: No rash.  Neurological:     General: No focal deficit present.     Mental Status: He is alert and oriented to person, place, and time.     Gait: Gait normal.  Psychiatric:        Mood and Affect: Mood normal.        Behavior: Behavior normal.    Assessment & Plan:   Wellness examination Assessment & Plan: Routine HCM labs ordered. HCM reviewed/discussed. Anticipatory guidance regarding healthy weight, lifestyle and choices given. Recommend healthy diet.  Recommend approximately 150 minutes/week of moderate intensity exercise Recommend regular dental and vision exams Always use seatbelt/lap and shoulder restraints Recommend using smoke alarms and checking batteries at least twice a year Recommend using sunscreen when outside Discussed  immunization recommendations  Orders: -     CBC with Differential/Platelet -     Comprehensive metabolic panel with GFR -     Lipid panel -     TSH Rfx on Abnormal to Free T4  Immunization due -     Tdap vaccine greater than or equal to 7yo IM  Routine screening for STI (sexually transmitted infection) -     RPR -     HIV Antibody (routine testing w rflx) -  Chlamydia/GC NAA, Confirmation  Return in about 1 year (around 09/16/2025) for CPE.    ___________________________________________ Iridessa Harrow de [place], MD, ABFM, CAQSM Primary Care and Sports Medicine [facility]

## 2024-09-17 LAB — RPR: RPR Ser Ql: NONREACTIVE

## 2024-09-17 LAB — HIV ANTIBODY (ROUTINE TESTING W REFLEX): HIV Screen 4th Generation wRfx: NONREACTIVE

## 2024-09-17 LAB — COMPREHENSIVE METABOLIC PANEL WITH GFR
ALT: 26 IU/L (ref 0–44)
AST: 23 IU/L (ref 0–40)
Albumin: 4.6 g/dL (ref 4.3–5.2)
Alkaline Phosphatase: 53 IU/L (ref 47–123)
BUN/Creatinine Ratio: 10 (ref 9–20)
BUN: 12 mg/dL (ref 6–20)
Bilirubin Total: 0.5 mg/dL (ref 0.0–1.2)
CO2: 22 mmol/L (ref 20–29)
Calcium: 9.8 mg/dL (ref 8.7–10.2)
Chloride: 100 mmol/L (ref 96–106)
Creatinine, Ser: 1.26 mg/dL (ref 0.76–1.27)
Globulin, Total: 2.7 g/dL (ref 1.5–4.5)
Glucose: 98 mg/dL (ref 70–99)
Potassium: 4.2 mmol/L (ref 3.5–5.2)
Sodium: 137 mmol/L (ref 134–144)
Total Protein: 7.3 g/dL (ref 6.0–8.5)
eGFR: 79 mL/min/1.73 (ref >59)

## 2024-09-17 LAB — CBC WITH DIFFERENTIAL/PLATELET
Basophils Absolute: 0 x10E3/uL (ref 0.0–0.2)
Basos: 0 %
EOS (ABSOLUTE): 0.1 x10E3/uL (ref 0.0–0.4)
Eos: 1 %
Hematocrit: 50 % (ref 37.5–51.0)
Hemoglobin: 15.7 g/dL (ref 13.0–17.7)
Immature Grans (Abs): 0 x10E3/uL (ref 0.0–0.1)
Immature Granulocytes: 0 %
Lymphocytes Absolute: 2.9 x10E3/uL (ref 0.7–3.1)
Lymphs: 32 %
MCH: 28.1 pg (ref 26.6–33.0)
MCHC: 31.4 g/dL — ABNORMAL LOW (ref 31.5–35.7)
MCV: 89 fL (ref 79–97)
Monocytes Absolute: 0.6 x10E3/uL (ref 0.1–0.9)
Monocytes: 7 %
Neutrophils Absolute: 5.3 x10E3/uL (ref 1.4–7.0)
Neutrophils: 60 %
Platelets: 263 x10E3/uL (ref 150–450)
RBC: 5.59 x10E6/uL (ref 4.14–5.80)
RDW: 13.2 % (ref 11.6–15.4)
WBC: 9 x10E3/uL (ref 3.4–10.8)

## 2024-09-17 LAB — LIPID PANEL
Chol/HDL Ratio: 4.4 ratio (ref 0.0–5.0)
Cholesterol, Total: 198 mg/dL (ref 100–199)
HDL: 45 mg/dL (ref >39)
LDL Chol Calc (NIH): 133 mg/dL — ABNORMAL HIGH (ref 0–99)
Triglycerides: 111 mg/dL (ref 0–149)
VLDL Cholesterol Cal: 20 mg/dL (ref 5–40)

## 2024-09-17 LAB — TSH RFX ON ABNORMAL TO FREE T4: TSH: 1.99 u[IU]/mL (ref 0.450–4.500)

## 2024-09-19 LAB — CHLAMYDIA/GC NAA, CONFIRMATION
Chlamydia trachomatis, NAA: NEGATIVE
Neisseria gonorrhoeae, NAA: NEGATIVE

## 2024-09-20 ENCOUNTER — Ambulatory Visit (HOSPITAL_BASED_OUTPATIENT_CLINIC_OR_DEPARTMENT_OTHER): Payer: Self-pay | Admitting: Family Medicine

## 2024-10-03 ENCOUNTER — Other Ambulatory Visit: Payer: Self-pay | Admitting: Medical Genetics

## 2024-10-04 ENCOUNTER — Other Ambulatory Visit

## 2025-09-22 ENCOUNTER — Encounter (HOSPITAL_BASED_OUTPATIENT_CLINIC_OR_DEPARTMENT_OTHER): Admitting: Family Medicine
